# Patient Record
Sex: Female | Born: 1965 | Race: White | Hispanic: No | Smoking: Current some day smoker
Health system: Southern US, Community
[De-identification: ages and names within clinical notes are randomized; demographics above are authoritative.]

## PROBLEM LIST (undated history)

## (undated) DIAGNOSIS — E669 Obesity, unspecified: Secondary | ICD-10-CM

## (undated) DIAGNOSIS — I10 Essential (primary) hypertension: Secondary | ICD-10-CM

## (undated) DIAGNOSIS — IMO0001 Reserved for inherently not codable concepts without codable children: Secondary | ICD-10-CM

## (undated) DIAGNOSIS — N926 Irregular menstruation, unspecified: Principal | ICD-10-CM

## (undated) DIAGNOSIS — H9190 Unspecified hearing loss, unspecified ear: Secondary | ICD-10-CM

## (undated) DIAGNOSIS — E079 Disorder of thyroid, unspecified: Secondary | ICD-10-CM

## (undated) DIAGNOSIS — K589 Irritable bowel syndrome without diarrhea: Secondary | ICD-10-CM

## (undated) DIAGNOSIS — F32A Depression, unspecified: Secondary | ICD-10-CM

## (undated) DIAGNOSIS — Z862 Personal history of diseases of the blood and blood-forming organs and certain disorders involving the immune mechanism: Secondary | ICD-10-CM

## (undated) DIAGNOSIS — F329 Major depressive disorder, single episode, unspecified: Secondary | ICD-10-CM

## (undated) DIAGNOSIS — R51 Headache: Secondary | ICD-10-CM

## (undated) HISTORY — DX: Irritable bowel syndrome, unspecified: K58.9

## (undated) HISTORY — DX: Reserved for inherently not codable concepts without codable children: IMO0001

## (undated) HISTORY — DX: Unspecified hearing loss, unspecified ear: H91.90

## (undated) HISTORY — PX: WISDOM TOOTH EXTRACTION: SHX21

## (undated) HISTORY — DX: Obesity, unspecified: E66.9

## (undated) HISTORY — DX: Irregular menstruation, unspecified: N92.6

## (undated) HISTORY — DX: Headache: R51

## (undated) HISTORY — DX: Essential (primary) hypertension: I10

## (undated) HISTORY — DX: Major depressive disorder, single episode, unspecified: F32.9

## (undated) HISTORY — DX: Depression, unspecified: F32.A

## (undated) HISTORY — DX: Disorder of thyroid, unspecified: E07.9

## (undated) HISTORY — DX: Personal history of diseases of the blood and blood-forming organs and certain disorders involving the immune mechanism: Z86.2

---

## 2001-08-26 ENCOUNTER — Ambulatory Visit (HOSPITAL_COMMUNITY): Admission: RE | Admit: 2001-08-26 | Discharge: 2001-08-26 | Payer: Self-pay | Admitting: Family Medicine

## 2001-08-26 ENCOUNTER — Encounter: Payer: Self-pay | Admitting: Family Medicine

## 2002-01-09 ENCOUNTER — Encounter: Payer: Self-pay | Admitting: Family Medicine

## 2002-01-09 ENCOUNTER — Ambulatory Visit (HOSPITAL_COMMUNITY): Admission: RE | Admit: 2002-01-09 | Discharge: 2002-01-09 | Payer: Self-pay | Admitting: Family Medicine

## 2003-02-27 ENCOUNTER — Ambulatory Visit (HOSPITAL_COMMUNITY): Admission: RE | Admit: 2003-02-27 | Discharge: 2003-02-27 | Payer: Self-pay | Admitting: Family Medicine

## 2003-02-27 ENCOUNTER — Encounter: Payer: Self-pay | Admitting: Family Medicine

## 2003-12-15 ENCOUNTER — Other Ambulatory Visit: Admission: RE | Admit: 2003-12-15 | Discharge: 2003-12-15 | Payer: Self-pay | Admitting: Obstetrics and Gynecology

## 2004-01-15 ENCOUNTER — Emergency Department (HOSPITAL_COMMUNITY): Admission: EM | Admit: 2004-01-15 | Discharge: 2004-01-16 | Payer: Self-pay | Admitting: Emergency Medicine

## 2005-08-15 ENCOUNTER — Emergency Department (HOSPITAL_COMMUNITY): Admission: EM | Admit: 2005-08-15 | Discharge: 2005-08-15 | Payer: Self-pay | Admitting: Emergency Medicine

## 2005-08-15 ENCOUNTER — Inpatient Hospital Stay (HOSPITAL_COMMUNITY): Admission: RE | Admit: 2005-08-15 | Discharge: 2005-08-20 | Payer: Self-pay | Admitting: Psychiatry

## 2005-08-16 ENCOUNTER — Ambulatory Visit: Payer: Self-pay | Admitting: Psychiatry

## 2005-09-10 ENCOUNTER — Ambulatory Visit (HOSPITAL_COMMUNITY): Payer: Self-pay | Admitting: Psychiatry

## 2005-10-08 ENCOUNTER — Ambulatory Visit (HOSPITAL_COMMUNITY): Payer: Self-pay | Admitting: Psychiatry

## 2005-11-19 ENCOUNTER — Ambulatory Visit (HOSPITAL_COMMUNITY): Payer: Self-pay | Admitting: Psychiatry

## 2006-01-21 ENCOUNTER — Ambulatory Visit (HOSPITAL_COMMUNITY): Payer: Self-pay | Admitting: Psychiatry

## 2006-03-25 ENCOUNTER — Ambulatory Visit (HOSPITAL_COMMUNITY): Payer: Self-pay | Admitting: Psychiatry

## 2006-06-26 ENCOUNTER — Ambulatory Visit (HOSPITAL_COMMUNITY): Payer: Self-pay | Admitting: Psychiatry

## 2006-09-16 ENCOUNTER — Ambulatory Visit (HOSPITAL_COMMUNITY): Payer: Self-pay | Admitting: Psychiatry

## 2006-10-16 ENCOUNTER — Ambulatory Visit (HOSPITAL_COMMUNITY): Payer: Self-pay | Admitting: Psychiatry

## 2007-01-17 ENCOUNTER — Ambulatory Visit (HOSPITAL_COMMUNITY): Payer: Self-pay | Admitting: Psychiatry

## 2007-09-15 ENCOUNTER — Ambulatory Visit (HOSPITAL_COMMUNITY): Payer: Self-pay | Admitting: Psychiatry

## 2008-01-01 ENCOUNTER — Ambulatory Visit (HOSPITAL_COMMUNITY): Payer: Self-pay | Admitting: Psychiatry

## 2008-06-24 ENCOUNTER — Ambulatory Visit (HOSPITAL_COMMUNITY): Payer: Self-pay | Admitting: Psychiatry

## 2008-10-07 ENCOUNTER — Ambulatory Visit (HOSPITAL_COMMUNITY): Payer: Self-pay | Admitting: Psychiatry

## 2008-10-18 ENCOUNTER — Emergency Department (HOSPITAL_COMMUNITY): Admission: EM | Admit: 2008-10-18 | Discharge: 2008-10-18 | Payer: Self-pay | Admitting: Emergency Medicine

## 2009-02-01 ENCOUNTER — Ambulatory Visit (HOSPITAL_COMMUNITY): Payer: Self-pay | Admitting: Psychiatry

## 2009-02-24 ENCOUNTER — Other Ambulatory Visit: Admission: RE | Admit: 2009-02-24 | Discharge: 2009-02-24 | Payer: Self-pay | Admitting: Obstetrics and Gynecology

## 2009-05-03 ENCOUNTER — Ambulatory Visit (HOSPITAL_COMMUNITY): Payer: Self-pay | Admitting: Psychiatry

## 2009-08-30 ENCOUNTER — Ambulatory Visit (HOSPITAL_COMMUNITY): Payer: Self-pay | Admitting: Psychiatry

## 2010-01-19 ENCOUNTER — Ambulatory Visit (HOSPITAL_COMMUNITY): Payer: Self-pay | Admitting: Psychiatry

## 2010-03-30 ENCOUNTER — Ambulatory Visit (HOSPITAL_COMMUNITY): Payer: Self-pay | Admitting: Psychiatry

## 2010-06-27 ENCOUNTER — Ambulatory Visit (HOSPITAL_COMMUNITY)
Admission: RE | Admit: 2010-06-27 | Discharge: 2010-06-27 | Payer: Self-pay | Source: Home / Self Care | Attending: Psychiatry | Admitting: Psychiatry

## 2010-09-19 LAB — URINE MICROSCOPIC-ADD ON

## 2010-09-19 LAB — URINE CULTURE: Colony Count: >=100000

## 2010-09-19 LAB — URINALYSIS, ROUTINE W REFLEX MICROSCOPIC
Bilirubin Urine: NEGATIVE
Glucose, UA: NEGATIVE mg/dL
Ketones, ur: 15 mg/dL — AB
Leukocytes, UA: NEGATIVE
Nitrite: NEGATIVE
Protein, ur: >300 mg/dL — AB
Specific Gravity, Urine: >1.03 — ABNORMAL HIGH (ref 1.005–1.030)
Urobilinogen, UA: 0.2 mg/dL (ref 0.0–1.0)
pH: 6 (ref 5.0–8.0)

## 2010-09-26 ENCOUNTER — Encounter (HOSPITAL_COMMUNITY): Payer: Self-pay | Admitting: Psychiatry

## 2010-10-27 NOTE — Discharge Summary (Signed)
NAMEBENAY, POMEROY NO.:  [id number]   MEDICAL RECORD NO.:  [id number]          PATIENT TYPE:  IPS   LOCATION:  0504                          FACILITY:  [facility]   PHYSICIAN:  Jeanice Lim, M.D. DATE OF BIRTH:  1965-12-14   DATE OF ADMISSION:  08/15/2005  DATE OF DISCHARGE:  08/20/2005                                 DISCHARGE SUMMARY   IDENTIFYING DATA:  This is a 45 year old divorced female voluntarily  admitted.  Suicidal ideation with plan to overdose.  History of recent  changes in medication.  History of depression, having thoughts of  overdosing, sleeping too much, gaining weight.  No specific stressors.  Thinks about hurting himself when he is alone.  No clear psychotic symptoms.   PAST PSYCHIATRIC HISTORY:  First admission.  No current outpatient  treatment.   MEDICATIONS:  Had been on Prozac prior to Cymbalta.   ALLERGIES:  CELEXA and WELLBUTRIN caused anxiety and insomnia.   FAMILY HISTORY:  Mother, grandmother, sister and brother all have history of  depression.   MEDICAL PROBLEMS:  None.   MEDICATIONS:  Cymbalta 30 mg daily for one month and Mucinex.   ALLERGIES:  SULFA.   PHYSICAL EXAMINATION:  Physical and neurologic exam essentially within  normal limits.   REVIEW OF SYSTEMS:  Negative.  Somewhat overweight.   MENTAL STATUS EXAM:  Fully alert, cooperative.  Good eye contact.  Speech  clear, normal rate.  Mood depressed.  Affect restricted.  Thought processes  goal directed.  No evidence of psychotic symptoms.  Positive suicidal  thoughts.  Cognitively intact.  Judgment and insight impaired.  Appears  sincere.   ADMISSION DIAGNOSES:  AXIS I:  Major depressive disorder, recurrent, severe.  AXIS II:  Deferred.  AXIS III:  None.  AXIS IV:  Moderate (problems with primary support group, limited support  system, other psychosocial issues).  AXIS V:  35/55-60.   HOSPITAL COURSE:  The patient was admitted and ordered routine p.r.n.  medications and underwent further monitoring.  Cymbalta was discontinued and  patient was monitored and risk/benefit ratio and alternative treatments were  discussed and patient was optimized on medications.  Had been sleeping  excessively and was given a trial on Zoloft and monitored regarding side  effects.  The patient complained of a headache when she requested pain  medications.  She was discharged with follow-up plan in place with no safety  concerns.  Tolerating medication.  Advised on how long it takes for  medications to work, what to do if experiencing side effects.  Given  medication education review at the time of discharge.   DISCHARGE MEDICATIONS:  1.  Provigil 200 mg, 1/2 in the morning, 1/2 at 11 a.m.  2.  Lamictal 25 mg at bedtime for eight days and then 2 at night.  3.  Allegra 180 mg at bedtime.  4.  Ultram 50 mg, 2 every eight hours as needed.   The patient was to stop Zoloft if headache does not improve.  To be  reevaluated by primary care physician, could stop Lamictal and restart  Zoloft  if headache does not clear after stopping Zoloft.   FOLLOW UP:  The patient had a follow-up appointment with [facility] intensive outpatient treatment for close follow-up to get medications  straight as far as side effects, tolerability and to monitor the patient a  little further in an intensive setting.   DISCHARGE DIAGNOSES:  AXIS I:  Major depressive disorder, recurrent, severe.  AXIS II:  Deferred.  AXIS III:  None.  AXIS IV:  Moderate (problems with primary support group, limited support  system, other psychosocial issues).  AXIS V:  GAF on discharge 60.      Jeanice Lim, M.D.  Electronically Signed     JEM/MEDQ  D:  09/09/2005  T:  09/11/2005  Job:  [id number]

## 2010-10-31 ENCOUNTER — Encounter (HOSPITAL_COMMUNITY): Payer: Self-pay | Admitting: Psychiatry

## 2010-11-02 ENCOUNTER — Encounter (INDEPENDENT_AMBULATORY_CARE_PROVIDER_SITE_OTHER): Payer: Self-pay | Admitting: Psychiatry

## 2010-11-02 DIAGNOSIS — F331 Major depressive disorder, recurrent, moderate: Secondary | ICD-10-CM

## 2010-11-21 ENCOUNTER — Encounter (INDEPENDENT_AMBULATORY_CARE_PROVIDER_SITE_OTHER): Payer: Self-pay | Admitting: Psychiatry

## 2010-11-21 DIAGNOSIS — F329 Major depressive disorder, single episode, unspecified: Secondary | ICD-10-CM

## 2010-11-21 DIAGNOSIS — F3289 Other specified depressive episodes: Secondary | ICD-10-CM

## 2011-01-02 ENCOUNTER — Encounter (HOSPITAL_COMMUNITY): Payer: Self-pay | Admitting: Psychiatry

## 2011-03-06 ENCOUNTER — Encounter (HOSPITAL_COMMUNITY): Payer: Self-pay | Admitting: Psychiatry

## 2011-03-15 ENCOUNTER — Encounter (INDEPENDENT_AMBULATORY_CARE_PROVIDER_SITE_OTHER): Payer: Self-pay | Admitting: Psychiatry

## 2011-03-15 DIAGNOSIS — F331 Major depressive disorder, recurrent, moderate: Secondary | ICD-10-CM

## 2011-05-08 ENCOUNTER — Ambulatory Visit (HOSPITAL_COMMUNITY): Payer: Self-pay | Admitting: Psychiatry

## 2011-05-08 ENCOUNTER — Encounter (HOSPITAL_COMMUNITY): Payer: Self-pay | Admitting: Psychiatry

## 2011-05-22 ENCOUNTER — Ambulatory Visit (HOSPITAL_COMMUNITY): Payer: Self-pay | Admitting: Psychiatry

## 2011-05-22 DIAGNOSIS — F32A Depression, unspecified: Secondary | ICD-10-CM

## 2011-05-22 DIAGNOSIS — F3289 Other specified depressive episodes: Secondary | ICD-10-CM

## 2011-05-22 DIAGNOSIS — F329 Major depressive disorder, single episode, unspecified: Secondary | ICD-10-CM

## 2011-05-22 MED ORDER — CITALOPRAM HYDROBROMIDE 40 MG PO TABS: 40.0000 mg | ORAL_TABLET | Freq: Every day | ORAL | Status: DC

## 2011-05-22 NOTE — Progress Notes (Signed)
Joanne Ertl [id number]  Subjective Patient came in for her followup appointment. She's been doing good on her current medication. She is keeping her job at [employer]. Patient reported 2 months ago she was fired as she was taking care of her daughter who has recently delivered a baby girl however after talking to given resources patient was able to get her job back. Patient is very excited and like her current job. Her grandchild is now 39 months old. She denies any crying spells agitation or social isolation. She does not require trazodone. She is sleeping better. We are still waiting for her blood work which she [facility]. She was informed that she will get results in mail.  Mental Status Examination  patient is casually dressed and well groomed. She denies any active or passive suicidal thoughts or homicidal thoughts. She described her mood is good and her affect is bright. Her speech is soft clear and coherent. There are no psychotic symptoms present. She denies any auditory or visual hallucination. Her attention and concentration is good. Her insight judgment and impulse control is okay  Current Medication Current outpatient prescriptions:citalopram (CELEXA) 40 MG tablet, Take 1 tablet (40 mg total) by mouth daily., Disp: 90 tablet, Rfl: 0   Assessment Depressive disorder NOS  Plan I will continue Celexa 40 mg daily. Patient will send has lab results soon. I have explained risks and benefits of medication. I recommended to call if she has worsening of her depression. I will see her in 3 months

## 2011-05-25 ENCOUNTER — Other Ambulatory Visit (HOSPITAL_COMMUNITY): Payer: Self-pay | Admitting: Psychiatry

## 2011-08-21 ENCOUNTER — Ambulatory Visit (INDEPENDENT_AMBULATORY_CARE_PROVIDER_SITE_OTHER): Payer: Self-pay | Admitting: Psychiatry

## 2011-08-21 ENCOUNTER — Encounter (HOSPITAL_COMMUNITY): Payer: Self-pay | Admitting: Psychiatry

## 2011-08-21 DIAGNOSIS — F32A Depression, unspecified: Secondary | ICD-10-CM | POA: Insufficient documentation

## 2011-08-21 DIAGNOSIS — F329 Major depressive disorder, single episode, unspecified: Secondary | ICD-10-CM

## 2011-08-21 DIAGNOSIS — F3289 Other specified depressive episodes: Secondary | ICD-10-CM

## 2011-08-21 MED ORDER — CITALOPRAM HYDROBROMIDE 40 MG PO TABS: 40.0000 mg | ORAL_TABLET | Freq: Every day | ORAL | Status: DC

## 2011-08-21 NOTE — Progress Notes (Signed)
Chief complaint Medication management and followup  History of present illness Patient is 46 year old Caucasian currently unemployed female who came for her followup appointment. Patient was laid off in January from her job and now hoping she can find another job. She is busy with her grandson who is now 41 months old. She enjoyed to spending time with him. She denies any crying spells insomnia or agitation. She is somewhat concerned about her future but overall she is social and motivated to do things. She had applied and waiting for her response for employment. She denies any side effects of Celexa. She reported no side effects of medication. She denies drinking alcohol or using illegal substances. She is currently on unemployment.  Current psychiatric medication Celexa 40 mg daily  Past psychiatric history Patient has history of depression since 2000. She had tried Prozac, Cymbalta, Zoloft and Wellbutrin. She denies any history of suicidal attempt however [facility] due to suicidal thinking to overdose on pills.  Medical history Headache but does not take any prescribed medication  Mental status examination Patient is well groomed well dressed. She is calm cooperative and pleasant. She maintained good eye contact. Her speech is soft clear and coherent. Her thought process is logical linear and goal-directed. She denies any paranoia or delusional thinking. She denies any auditory or visual hallucination. She described her mood as anxious and her affect is mood congruent. She denies any active or passive suicidal thinking and homicidal thinking. She's alert and oriented x3. Her attention and concentration is fair. Her insight judgment and impulse control is okay.  Assessment Depressive disorder NOS Axis II deferred Axis III see medical history Axis IV mild to moderate  Plan I have reviewed her blood work which was done in December. She has a normal  CBC and chemistry. I have discussed for blood results the patient. I will continue Celexa 40 mg daily. At this time patient reported no side effects of medication. I will see her again in 3 months. Time spent 30 minutes

## 2011-11-20 ENCOUNTER — Encounter (HOSPITAL_COMMUNITY): Payer: Self-pay | Admitting: Psychiatry

## 2011-11-20 ENCOUNTER — Ambulatory Visit (INDEPENDENT_AMBULATORY_CARE_PROVIDER_SITE_OTHER): Payer: Self-pay | Admitting: Psychiatry

## 2011-11-20 DIAGNOSIS — F32A Depression, unspecified: Secondary | ICD-10-CM

## 2011-11-20 DIAGNOSIS — F3289 Other specified depressive episodes: Secondary | ICD-10-CM

## 2011-11-20 DIAGNOSIS — F329 Major depressive disorder, single episode, unspecified: Secondary | ICD-10-CM

## 2011-11-20 MED ORDER — CITALOPRAM HYDROBROMIDE 40 MG PO TABS: 40.0000 mg | ORAL_TABLET | Freq: Every day | ORAL | Status: DC

## 2011-11-20 NOTE — Progress Notes (Signed)
Chief complaint Medication management and followup  History of present illness Patient is 46 year old Caucasian currently unemployed female who came for her followup appointment.  Recently patient has been under stress .  Her father diagnosed with lung cancer with metastasis to his brain.  He will start radiation and chemotherapy.  Patient is very close to his father and very upset with this news.  She's afraid about her father health.  Patient is not very close with her mother.  She likes Celexa at present does.  She admitted recently having some crying spells and poor sleep but does not want to try any other medication.  She denies any agitation anger mood swing.  She has no issues with Celexa.  She continues to have difficulty finding job.  She's not taking using any illegal substance.  Current psychiatric medication Celexa 40 mg daily  Past psychiatric history Patient has history of depression since 2000. She had tried Prozac, Cymbalta, Zoloft and Wellbutrin. She denies any history of suicidal attempt however [facility] due to suicidal thinking to overdose on pills.  Medical history Headache but does not take any prescribed medication  Mental status examination Patient is well groomed well dressed. She is anxious but cooperative.  She maintained fair eye contact.  She described her mood is sad and her affect is constricted.  She denies any active or passive suicidal thoughts or homicidal thoughts.  She denies any psychotic symptoms.  Her attention and concentration is fair.  She denies any auditory or visual hallucination.  She's alert and oriented x3.  Her insight judgment and impulse control is okay.  Assessment Depressive disorder NOS Axis II deferred Axis III see medical history Axis IV mild to moderate  Plan I offered counseling however patient refused .  She agreed to give [place] a call if she needed counseling and therapy.  I will continue her  Celexa 40 mg at present does.  She denies any side effects of medication.  I recommend to call us if she is a question or concern about the medication or if she feel worsening of the symptoms.  I will see her again in 3 months.    Portion of this note is generated with voice recognition software and may contain typographical error.

## 2012-02-19 ENCOUNTER — Encounter (HOSPITAL_COMMUNITY): Payer: Self-pay | Admitting: Psychiatry

## 2012-02-19 ENCOUNTER — Ambulatory Visit (INDEPENDENT_AMBULATORY_CARE_PROVIDER_SITE_OTHER): Payer: Self-pay | Admitting: Psychiatry

## 2012-02-19 DIAGNOSIS — F32A Depression, unspecified: Secondary | ICD-10-CM

## 2012-02-19 DIAGNOSIS — F329 Major depressive disorder, single episode, unspecified: Secondary | ICD-10-CM

## 2012-02-19 DIAGNOSIS — F3289 Other specified depressive episodes: Secondary | ICD-10-CM

## 2012-02-19 MED ORDER — CITALOPRAM HYDROBROMIDE 40 MG PO TABS: 40.0000 mg | ORAL_TABLET | Freq: Every day | ORAL | Status: DC

## 2012-02-19 NOTE — Progress Notes (Signed)
Chief complaint Medication management and followup  History of present illness Patient is 46 year old Caucasian female who came for her followup appointment.  Patient recently hired again 3 weeks ago and feeling very happy about her job.  She's been compliant with Celexa and reported no side effects.  She's living with her mother but like to have her own place.  She denies any agitation anger mood swing.  She denies any crying spells.  She is worried about her father who has a lung cancer and recently moved to a nursing home.  She denies any side effects of Celexa.  She's not taking using any illegal substance.  Current psychiatric medication Celexa 40 mg daily  Past psychiatric history Patient has history of depression since 2000. She had tried Prozac, Cymbalta, Zoloft and Wellbutrin. She denies any history of suicidal attempt however [facility] due to suicidal thinking to overdose on pills.  Medical history Headache but does not take any prescribed medication  Social history Patient lives with her mother however she wants to have her own place because she does not get along with her mother.  Her father is living in nursing home.  He suffering from metastasis lung cancer.  Mental status examination Patient is well groomed well dressed. She is anxious but cooperative.  She maintained fair eye contact.  She described her mood is good and her affect is mood appropriate.  She denies any active or passive suicidal thoughts or homicidal thoughts.  She denies any psychotic symptoms.  Her attention and concentration is fair.  She denies any auditory or visual hallucination.  She's alert and oriented x3.  Her insight judgment and impulse control is okay.  Assessment Depressive disorder NOS Axis II deferred Axis III see medical history Axis IV mild to moderate  Plan I will continue Celexa at present does it.  I explained risks and benefits of medication.   We will consider getting blood work on her next visit.  I recommend to call us if she has any question or concern if she feels worsening of the symptom.  I will see her again in 3 months.  Portion of this note is generated with voice recognition software and may contain typographical error.

## 2012-02-26 ENCOUNTER — Emergency Department (HOSPITAL_COMMUNITY)
Admission: EM | Admit: 2012-02-26 | Discharge: 2012-02-26 | Disposition: A | Discharge status: Home / Self Care | Payer: Self-pay | Attending: Emergency Medicine | Admitting: Emergency Medicine

## 2012-02-26 ENCOUNTER — Emergency Department (HOSPITAL_COMMUNITY): Payer: Self-pay

## 2012-02-26 ENCOUNTER — Encounter (HOSPITAL_COMMUNITY): Payer: Self-pay | Admitting: Emergency Medicine

## 2012-02-26 DIAGNOSIS — Z882 Allergy status to sulfonamides status: Secondary | ICD-10-CM | POA: Insufficient documentation

## 2012-02-26 DIAGNOSIS — J189 Pneumonia, unspecified organism: Secondary | ICD-10-CM | POA: Insufficient documentation

## 2012-02-26 DIAGNOSIS — F17200 Nicotine dependence, unspecified, uncomplicated: Secondary | ICD-10-CM | POA: Insufficient documentation

## 2012-02-26 MED ORDER — CIPROFLOXACIN HCL 500 MG PO TABS: 500.0000 mg | ORAL_TABLET | Freq: Two times a day (BID) | ORAL | Status: DC

## 2012-02-26 MED ORDER — IBUPROFEN 800 MG PO TABS
800.0000 mg | ORAL_TABLET | Freq: Once | ORAL | Status: AC
  Administered 2012-02-26: 800 mg via ORAL
  Filled 2012-02-26: qty 1

## 2012-02-26 MED ORDER — CIPROFLOXACIN HCL 250 MG PO TABS
500.0000 mg | ORAL_TABLET | Freq: Once | ORAL | Status: AC
  Administered 2012-02-26: 500 mg via ORAL
  Filled 2012-02-26: qty 2

## 2012-02-26 MED ORDER — ONDANSETRON HCL 4 MG PO TABS
4.0000 mg | ORAL_TABLET | Freq: Once | ORAL | Status: AC
  Administered 2012-02-26: 4 mg via ORAL
  Filled 2012-02-26: qty 1

## 2012-02-26 MED ORDER — PROMETHAZINE-CODEINE 6.25-10 MG/5ML PO SYRP: 5.0000 mL | ORAL_SOLUTION | Freq: Four times a day (QID) | ORAL | Status: DC | PRN

## 2012-02-26 NOTE — ED Notes (Signed)
Pt states woke up with dry cough/congestion this am. C/o body hurting. Denies fever/chills/v/d. Nad. Color wnl. No resp distress

## 2012-03-03 NOTE — ED Provider Notes (Signed)
History     CSN: [id number]  Arrival date & time 02/26/12  1052   First MD Initiated Contact with Patient 02/26/12 1219      Chief Complaint  Patient presents with  . Cough  . Generalized Body Aches    (Consider location/radiation/quality/duration/timing/severity/associated sxs/prior treatment) Patient is a 46 y.o. female presenting with cough. The history is provided by the patient.  Cough This is a new problem. The current episode started 6 to 12 hours ago. The problem occurs hourly. The problem has been gradually worsening. The cough is non-productive. Associated symptoms include rhinorrhea. Pertinent negatives include no chest pain, no chills, no sweats, no shortness of breath and no wheezing. She has tried nothing for the symptoms. She is a smoker. Her past medical history is significant for bronchitis.    Past Medical History  Diagnosis Date  . History of anemia   . Headache   . Hearing impairment     History reviewed. No pertinent past surgical history.  Family History  Problem Relation Age of Onset  . Depression Sister   . Depression Brother     History  Substance Use Topics  . Smoking status: Current Every Day Smoker    Types: Cigarettes  . Smokeless tobacco: Not on file  . Alcohol Use: No    OB History    Grav Para Term Preterm Abortions TAB SAB Ect Mult Living                  Review of Systems  Constitutional: Negative for chills and activity change.       All ROS Neg except as noted in HPI  HENT: Positive for hearing loss and rhinorrhea. Negative for nosebleeds and neck pain.   Eyes: Negative for photophobia and discharge.  Respiratory: Positive for cough. Negative for shortness of breath and wheezing.   Cardiovascular: Negative for chest pain and palpitations.  Gastrointestinal: Negative for abdominal pain and blood in stool.  Genitourinary: Negative for dysuria, frequency and hematuria.  Musculoskeletal: Negative for back pain and arthralgias.   Skin: Negative.   Neurological: Positive for weakness. Negative for dizziness, seizures and speech difficulty.  Psychiatric/Behavioral: Negative for hallucinations and confusion.    Allergies  Sulfa antibiotics  Home Medications   Current Outpatient Rx  Name Route Sig Dispense Refill  . CITALOPRAM HYDROBROMIDE 40 MG PO TABS Oral Take 1 tablet (40 mg total) by mouth daily. 90 tablet 0  . GUAIFENESIN ER 600 MG PO TB12 Oral Take 1,200 mg by mouth 2 (two) times daily.    . ADULT MULTIVITAMIN W/MINERALS CH Oral Take 1 tablet by mouth daily.    Marland Kitchen CIPROFLOXACIN HCL 500 MG PO TABS Oral Take 1 tablet (500 mg total) by mouth every 12 (twelve) hours. 14 tablet 0    Take with food  . PROMETHAZINE-CODEINE 6.25-10 MG/5ML PO SYRP Oral Take 5 mLs by mouth every 6 (six) hours as needed for cough. 150 mL 0    BP 120/75  Pulse 92  Temp 98.7 F (37.1 C)  Resp 17  SpO2 100%  LMP 01/27/2012  Physical Exam  Nursing note and vitals reviewed. Constitutional: She is oriented to person, place, and time. She appears well-developed and well-nourished.  Non-toxic appearance.  HENT:  Head: Normocephalic.  Right Ear: Tympanic membrane and external ear normal.  Left Ear: Tympanic membrane and external ear normal.       Difficulty hearing  Eyes: EOM and lids are normal. Pupils are equal, round, and reactive  to light.  Neck: Normal range of motion. Neck supple. Carotid bruit is not present.  Cardiovascular: Normal rate, regular rhythm, normal heart sounds, intact distal pulses and normal pulses.   Pulmonary/Chest: Breath sounds normal. No respiratory distress.       Course breath sounds with scattered rhonchi. Mild to moderate chest wall soreness.  Abdominal: Soft. Bowel sounds are normal. There is no tenderness. There is no guarding.  Musculoskeletal: Normal range of motion.  Lymphadenopathy:       Head (right side): No submandibular adenopathy present.       Head (left side): No submandibular  adenopathy present.    She has no cervical adenopathy.  Neurological: She is alert and oriented to person, place, and time. She has normal strength. No cranial nerve deficit or sensory deficit.  Skin: Skin is warm and dry.  Psychiatric: She has a normal mood and affect. Her speech is normal.    ED Course  Procedures (including critical care time)  Labs Reviewed - No data to display No results found.   1. Community acquired pneumonia       MDM  I have reviewed nursing notes, vital signs, and all appropriate lab and imaging results for this patient. Chest xray is consistent with RML pneumonia. Rx for cipro and promethazine cough med given to the patient. She will follow up with her primary MD or the [facility] in 4 to 5 days.       Kathie Dike, [place] 03/03/12 2232

## 2012-03-05 NOTE — ED Provider Notes (Signed)
Medical screening examination/treatment/procedure(s) were performed by non-physician practitioner and as supervising physician I was immediately available for consultation/collaboration.   Maveric Debono W. Maurilio Puryear, MD 03/05/12 0039 

## 2012-04-01 ENCOUNTER — Emergency Department (HOSPITAL_COMMUNITY): Payer: BC Managed Care – PPO

## 2012-04-01 ENCOUNTER — Encounter (HOSPITAL_COMMUNITY): Payer: Self-pay

## 2012-04-01 ENCOUNTER — Emergency Department (HOSPITAL_COMMUNITY)
Admission: EM | Admit: 2012-04-01 | Discharge: 2012-04-01 | Disposition: A | Discharge status: Home / Self Care | Payer: BC Managed Care – PPO | Attending: Emergency Medicine | Admitting: Emergency Medicine

## 2012-04-01 DIAGNOSIS — Z8669 Personal history of other diseases of the nervous system and sense organs: Secondary | ICD-10-CM | POA: Insufficient documentation

## 2012-04-01 DIAGNOSIS — IMO0002 Reserved for concepts with insufficient information to code with codable children: Secondary | ICD-10-CM | POA: Insufficient documentation

## 2012-04-01 DIAGNOSIS — M792 Neuralgia and neuritis, unspecified: Secondary | ICD-10-CM

## 2012-04-01 DIAGNOSIS — Z862 Personal history of diseases of the blood and blood-forming organs and certain disorders involving the immune mechanism: Secondary | ICD-10-CM | POA: Insufficient documentation

## 2012-04-01 DIAGNOSIS — F17200 Nicotine dependence, unspecified, uncomplicated: Secondary | ICD-10-CM | POA: Insufficient documentation

## 2012-04-01 MED ORDER — CELECOXIB 100 MG PO CAPS: 100.0000 mg | ORAL_CAPSULE | Freq: Two times a day (BID) | ORAL | Status: DC

## 2012-04-01 NOTE — ED Notes (Signed)
Discharge instructions reviewed with pt, questions answered. Pt verbalized understanding.  

## 2012-04-01 NOTE — ED Notes (Addendum)
Pt c/o numbness to left shoulder, chest, and upper back since Thursday, oct 17th.  Denies any pain or weakness.  C/O frequent headaches.

## 2012-04-01 NOTE — ED Provider Notes (Signed)
History  This chart was scribed for Benny Lennert, MD by Bennett Scrape. This patient was seen in room APA18/APA18 and the patient's care was started at 6:02PM  CSN: [id number]  Arrival date & time 04/01/12  1715   First MD Initiated Contact with Patient 04/01/12 1802      Chief Complaint  Patient presents with  . Numbness     The history is provided by the patient. No language interpreter was used.   Joanne Frazier is a 46 y.o. female who presents to the Emergency Department complaining of 5 days of gradual onset, non-changing, constant left shoulder numbness described as tingling that radiates out into the upper left arm and upper left chest that she noticed while adjusting her bra strap. She denies having any modifying factors. She denies doing any recent heavy lifting or having any recent falls or injuries. She denies having prior episodes of similar symptoms.She denies weakness, CP, neck pain and diaphoresis as associated symptoms. She has a h/o anemia and is a current everyday smoker but denies alcohol use.  Works for [employer] and states that she just returned to work after a lay off.  No PCP  Past Medical History  Diagnosis Date  . History of anemia   . Headache   . Hearing impairment     History reviewed. No pertinent past surgical history.  Family History  Problem Relation Age of Onset  . Depression Sister   . Depression Brother     History  Substance Use Topics  . Smoking status: Current Every Day Smoker    Types: Cigarettes  . Smokeless tobacco: Not on file  . Alcohol Use: No    No OB history provided.  Review of Systems  Constitutional: Negative.  Negative for fatigue.  HENT: Negative for congestion, sinus pressure and ear discharge.   Eyes: Negative.  Negative for discharge.  Respiratory: Negative for cough.   Cardiovascular: Negative for chest pain.  Gastrointestinal: Negative for abdominal pain and diarrhea.  Genitourinary: Negative.   Negative for frequency and hematuria.  Musculoskeletal: Negative for back pain.  Skin: Negative for rash.  Neurological: Positive for numbness. Negative for tremors, seizures, speech difficulty, weakness and headaches.  Hematological: Negative.   Psychiatric/Behavioral: Negative for hallucinations.    Allergies  Sulfa antibiotics  Home Medications   Current Outpatient Rx  Name Route Sig Dispense Refill  . CIPROFLOXACIN HCL 500 MG PO TABS Oral Take 1 tablet (500 mg total) by mouth every 12 (twelve) hours. 14 tablet 0    Take with food  . CITALOPRAM HYDROBROMIDE 40 MG PO TABS Oral Take 1 tablet (40 mg total) by mouth daily. 90 tablet 0  . GUAIFENESIN ER 600 MG PO TB12 Oral Take 1,200 mg by mouth 2 (two) times daily.    . ADULT MULTIVITAMIN W/MINERALS CH Oral Take 1 tablet by mouth daily.    Marland Kitchen PROMETHAZINE-CODEINE 6.25-10 MG/5ML PO SYRP Oral Take 5 mLs by mouth every 6 (six) hours as needed for cough. 150 mL 0    Triage Vitals: BP 139/79  Pulse 87  Temp 98.2 F (36.8 C) (Oral)  Resp 20  Ht 5\' 6"  (1.676 m)  Wt 217 lb (98.431 kg)  BMI 35.02 kg/m2  SpO2 98%  LMP 03/09/2012  Physical Exam  Nursing note and vitals reviewed. Constitutional: She is oriented to person, place, and time. She appears well-developed and well-nourished.  HENT:  Head: Normocephalic and atraumatic.  Eyes: Conjunctivae normal and EOM are normal. No scleral  icterus.  Neck: Normal range of motion. Neck supple. No thyromegaly present.  Cardiovascular: Normal rate and regular rhythm.  Exam reveals no gallop and no friction rub.   No murmur heard. Pulmonary/Chest: Effort normal and breath sounds normal. No stridor. She has no wheezes. She has no rales. She exhibits no tenderness.  Abdominal: She exhibits no distension. There is no tenderness. There is no rebound.  Musculoskeletal: Normal range of motion. She exhibits no edema.  Lymphadenopathy:    She has no cervical adenopathy.  Neurological: She is alert  and oriented to person, place, and time. Coordination normal.       Mild decreased sensation around the left shoulder anteriorly and posteriorly, normal strength in upper extremities    Skin: No rash noted. No erythema.  Psychiatric: She has a normal mood and affect. Her behavior is normal.    ED Course  Procedures (including critical care time)  DIAGNOSTIC STUDIES: Oxygen Saturation is 98% on room air, normal by my interpretation.    COORDINATION OF CARE: 6:10PM-Patient informed of current plan for treatment and evaluation and agrees with plan at this time.   8:10 PM-Discussed discharge plan which includes pain medication and a referral to a neurologist with pt and pt agreed to plan.  Labs Reviewed - No data to display Dg Cervical Spine Complete  04/01/2012  *RADIOLOGY REPORT*  Clinical Data: Left shoulder pain and left upper extremity numbness.  CERVICAL SPINE - COMPLETE 4+ VIEW  Comparison: None.  Findings: AP, lateral, swimmers, obliques and odontoid view of the cervical spine were obtained.  Normal alignment of the cervical spine.  Normal appearance of the prevertebral soft tissues.  Neural foramen are patent. No evidence for acute fracture.  IMPRESSION: Normal cervical spine series.   Original Report Authenticated By: Richarda Overlie, M.D.      No diagnosis found.    MDM        The chart was scribed for me under my direct supervision.  I personally performed the history, physical, and medical decision making and all procedures in the evaluation of this patient.Benny Lennert, MD 04/01/12 2020

## 2012-05-20 ENCOUNTER — Ambulatory Visit (INDEPENDENT_AMBULATORY_CARE_PROVIDER_SITE_OTHER): Payer: BC Managed Care – PPO | Admitting: Psychiatry

## 2012-05-20 ENCOUNTER — Encounter (HOSPITAL_COMMUNITY): Payer: Self-pay | Admitting: Psychiatry

## 2012-05-20 VITALS — BP 140/82 | HR 72 | Ht 66.0 in | Wt 225.4 lb

## 2012-05-20 DIAGNOSIS — F3289 Other specified depressive episodes: Secondary | ICD-10-CM

## 2012-05-20 DIAGNOSIS — F32A Depression, unspecified: Secondary | ICD-10-CM

## 2012-05-20 DIAGNOSIS — F329 Major depressive disorder, single episode, unspecified: Secondary | ICD-10-CM

## 2012-05-20 MED ORDER — CITALOPRAM HYDROBROMIDE 40 MG PO TABS: 40.0000 mg | ORAL_TABLET | Freq: Every day | ORAL | Status: DC

## 2012-05-20 NOTE — Patient Instructions (Addendum)
Could use "Move Free" or "Osteo bi Flex" for arthritic pain.   The important ingredients are Chondrotin Sulfate and Glucosamine.  Tumeric is also helpful for arthritis.   Cut back on sugar and carbohydrates, that means very limited fruits and starchy vegetables and very limited grains, breads  The goal is low GLYCEMIC INDEX.  Cut back on all wheat, rye, or barley  Eat avocados, eggs, lean meat like grass fed beef and chicken  For your brain regular exercise, regular sleep, and  consume good quality, fish oil, 1000 mg twice a day. Staying off all abusable substances including nicotine, caffeine, and refined sugar and avoiding further head injuries are the other important elements in helping you keep your brain working the best it can for you.  Make an appointment for counseling.  It may be very helpful for a better adjustment.

## 2012-05-20 NOTE — Progress Notes (Signed)
Chief complaint Chief Complaint  Patient presents with  . Depression  . Follow-up  . Establish Care  . Medication Refill   Subjective: "I didn't know nothing about Dr Lolly Mustache not being here any more.  I'm doing pretty good otherwise".  History of present illness Patient is 46 year old Caucasian female who came for her followup appointment. Pt reports compliance with psychotropic medication enjoying benefits without any side effects.  She notes no symptoms of depression, but is feeling some sadness and loss with the recent passing of her father.  He died of lung cancer in 26-Mar-2023.  She is now thinking about stopping smoking again.  She had quit for 10 years before.  She got started back when she lost her job.  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   Current psychiatric medication Celexa 40 mg daily  Past psychiatric history Patient has history of depression since 2000. She had tried Prozac, Cymbalta, Zoloft and Wellbutrin. She denies any history of suicidal attempt however [facility] due to suicidal thinking to overdose on pills.  Medical history Headache but does not take any prescribed medication  Social history Patient lives with her mother however she wants to have her own place because she does not get along with her mother.  Her father is living in nursing home.  He suffering from metastasis lung cancer.  Mental status examination Patient is well groomed well dressed. She is anxious but cooperative.  She maintained fair eye contact.  She described her mood is good and her affect is mood appropriate.  She denies any active or passive suicidal thoughts or homicidal thoughts.  She denies any psychotic symptoms.  Her attention and concentration is fair.  She denies any auditory or visual hallucination.  She's alert and oriented x3.  Her insight judgment and impulse control is okay.  Assessment Depressive disorder NOS Axis II  deferred Axis III see medical history Axis IV mild to moderate  Plan I took her vitals.  I reviewed CC, tobacco/med/surg Hx, meds effects/ side effects, problem list, therapies and responses as well as current situation/symptoms discussed options. See orders and pt instructions for more details.

## 2012-08-06 ENCOUNTER — Other Ambulatory Visit (HOSPITAL_COMMUNITY)
Admission: RE | Admit: 2012-08-06 | Discharge: 2012-08-06 | Disposition: A | Discharge status: Home / Self Care | Payer: BC Managed Care – PPO | Source: Ambulatory Visit | Attending: Obstetrics and Gynecology | Admitting: Obstetrics and Gynecology

## 2012-08-06 ENCOUNTER — Other Ambulatory Visit: Payer: Self-pay | Admitting: Adult Health

## 2012-08-06 ENCOUNTER — Other Ambulatory Visit (HOSPITAL_COMMUNITY): Payer: Self-pay | Admitting: Adult Health

## 2012-08-06 DIAGNOSIS — Z1151 Encounter for screening for human papillomavirus (HPV): Secondary | ICD-10-CM | POA: Insufficient documentation

## 2012-08-06 DIAGNOSIS — Z139 Encounter for screening, unspecified: Secondary | ICD-10-CM

## 2012-08-06 DIAGNOSIS — Z01419 Encounter for gynecological examination (general) (routine) without abnormal findings: Secondary | ICD-10-CM | POA: Insufficient documentation

## 2012-08-11 ENCOUNTER — Ambulatory Visit (HOSPITAL_COMMUNITY)
Admission: RE | Admit: 2012-08-11 | Discharge: 2012-08-11 | Disposition: A | Discharge status: Home / Self Care | Payer: BC Managed Care – PPO | Source: Ambulatory Visit | Attending: Adult Health | Admitting: Adult Health

## 2012-08-11 DIAGNOSIS — Z139 Encounter for screening, unspecified: Secondary | ICD-10-CM

## 2012-08-11 DIAGNOSIS — Z1231 Encounter for screening mammogram for malignant neoplasm of breast: Secondary | ICD-10-CM | POA: Insufficient documentation

## 2012-08-18 ENCOUNTER — Ambulatory Visit (INDEPENDENT_AMBULATORY_CARE_PROVIDER_SITE_OTHER): Payer: BC Managed Care – PPO | Admitting: Psychiatry

## 2012-08-18 ENCOUNTER — Encounter (HOSPITAL_COMMUNITY): Payer: Self-pay | Admitting: Psychiatry

## 2012-08-18 VITALS — Wt 228.4 lb

## 2012-08-18 DIAGNOSIS — F3289 Other specified depressive episodes: Secondary | ICD-10-CM

## 2012-08-18 DIAGNOSIS — R6882 Decreased libido: Secondary | ICD-10-CM

## 2012-08-18 DIAGNOSIS — F329 Major depressive disorder, single episode, unspecified: Secondary | ICD-10-CM

## 2012-08-18 DIAGNOSIS — F32A Depression, unspecified: Secondary | ICD-10-CM

## 2012-08-18 MED ORDER — CITALOPRAM HYDROBROMIDE 20 MG PO TABS: 30.0000 mg | ORAL_TABLET | Freq: Every day | ORAL | Status: DC

## 2012-08-18 MED ORDER — CYPROHEPTADINE HCL 4 MG PO TABS: 4.0000 mg | ORAL_TABLET | ORAL | Status: DC | PRN

## 2012-08-18 NOTE — Progress Notes (Signed)
[facility] [id number] Progress Note Joanne Frazier MRN: [id number] DOB: 12-26-1965 Age: 47 y.o.  Date: 08/18/2012 Start Time: 3:15 PM End Time: 3:30 PM  Chief Complaint: Chief Complaint  Patient presents with  . Depression  . Follow-up  . Medication Refill    Subjective: "I'm doing pretty well and I think I would like to try working my way down on the dose of Celexa.". Depression 0/10 and Anxiety 0/10, where 1 is the best and 10 is the worst.  Pain is 0/10.  History of present illness Patient is 47 year old Caucasian female who came for her followup appointment. Pt reports that she is compliant with the psychotropic medications with good benefit and some side effects.  She has noted decreased libido on the 40 mg where the libido was more present on 30 mg dose.  Discussed her trying Periactin for that.  She will also try a lower dose of Celexa too. She has been on this for well over 10 years.  This could be a reasonable time to try off the Celexa.  Will go slow as she notes some uncomfortable changes when she runs out of the medicine.   Current psychiatric medication Celexa 40 mg daily  Past psychiatric history Patient has history of depression since 2000. She had tried Prozac, Cymbalta, Zoloft and Wellbutrin. She denies any history of suicidal attempt however she was [facility] due to suicidal thinking to overdose on pills.  Medical history Headache but does not take any prescribed medication  Social history Patient lives with her mother however she wants to have her own place because she does not get along with her mother.  Her father is living in nursing home.  He suffering from metastasis lung cancer.  Family History family history includes ADD / ADHD in her daughter and son; Alcohol abuse in her paternal uncle; Depression in her brothers, maternal grandmother, and sister; and Seizures in her father.  There is no history of Drug abuse,  and Dementia, and Bipolar disorder, and Anxiety disorder, and OCD, and Paranoid behavior, and Schizophrenia, and Sexual abuse, and Physical abuse, .  Mental status examination Patient is well groomed well dressed. She is anxious but cooperative.  She maintained fair eye contact.  She described her mood is good and her affect is mood appropriate.  She denies any active or passive suicidal thoughts or homicidal thoughts.  She denies any psychotic symptoms.  Her attention and concentration is fair.  She denies any auditory or visual hallucination.  She's alert and oriented x3.  Her insight judgment and impulse control is okay.  Lab Results: No results found for this or any previous visit (from the past 8736 hour(s)). Dr Mohammed Kindle has drawn labs recently and her cholesterol was of concern.   Assessment Depressive disorder NOS Axis II deferred Axis III see medical history Axis IV mild to moderate  Plan/Discussion: I took her vitals.  I reviewed CC, tobacco/med/surg Hx, meds effects/ side effects, problem list, therapies and responses as well as current situation/symptoms discussed options. Try lower dose of Celexa and try Peroactin for the decreased llibido.  See orders and pt instructions for more details.  Medical Decision Making Problem Points:  Established problem, stable/improving (1), New problem, with no additional work-up planned (3), Review of last therapy session (1) and Review of psycho-social stressors (1) Data Points:  Review or order clinical lab tests (1) Review of medication regiment & side effects (2) Review of new medications  or change in dosage (2)  I certify that outpatient services furnished can reasonably be expected to improve the patient's condition.   Orson Aloe, MD, [facility]

## 2012-08-18 NOTE — Patient Instructions (Addendum)
Black Cohash may be helpful for menopause power surges.  Periactin may be helpful for the decreased libido.  Try using 1.5 of the 20 mg tabs, could try 1.75 tabs if the funny feeling comes back.  Call if problems or concerns.

## 2012-11-18 ENCOUNTER — Encounter (HOSPITAL_COMMUNITY): Payer: Self-pay | Admitting: Psychiatry

## 2012-11-18 ENCOUNTER — Ambulatory Visit (INDEPENDENT_AMBULATORY_CARE_PROVIDER_SITE_OTHER): Payer: BC Managed Care – PPO | Admitting: Psychiatry

## 2012-11-18 VITALS — BP 133/105 | HR 98 | Ht 65.0 in | Wt 229.4 lb

## 2012-11-18 DIAGNOSIS — F3289 Other specified depressive episodes: Secondary | ICD-10-CM

## 2012-11-18 DIAGNOSIS — R6882 Decreased libido: Secondary | ICD-10-CM

## 2012-11-18 DIAGNOSIS — F32A Depression, unspecified: Secondary | ICD-10-CM

## 2012-11-18 DIAGNOSIS — F329 Major depressive disorder, single episode, unspecified: Secondary | ICD-10-CM

## 2012-11-18 MED ORDER — CITALOPRAM HYDROBROMIDE 20 MG PO TABS: 20.0000 mg | ORAL_TABLET | Freq: Every day | ORAL | Status: DC

## 2012-11-18 NOTE — Progress Notes (Signed)
[facility] [id number] Progress Note Joanne Frazier MRN: [id number] DOB: [birth year] Age: 47 y.o.  Date: 11/18/2012 Start Time: 3:50  PM End Time: 4:15 PM  Chief Complaint: Chief Complaint  Patient presents with  . Depression  . Follow-up  . Medication Refill    Subjective: "I'm feeling real rough today.  I possibly have a stomach virus and I have been having a lot of headaches lately.  I am between doctors because my insurance changed and the new doctors won't see me until they get the records form the first doctor". Depression 0/10 and Anxiety 0/10, where 1 is the best and 10 is the worst.  Pain is 5/10 with her back today.  Stomach upset is 4/10 today.  History of present illness Patient is 47 year old Caucasian female who came for her followup appointment. Pt reports that she is compliant with the psychotropic medications with good benefit and no noticeable side effects.  She noted decreased libido on the 40 mg where the libido.  She hasn't even tried the Periactin for that, because she has no partner in her life now.  Current psychiatric medication Celexa 20 mg daily  Past psychiatric history Patient has history of depression since 2000. She had tried Prozac, Cymbalta, Zoloft and Wellbutrin. She denies any history of suicidal attempt however she [facility] due to suicidal thinking to overdose on pills.  Allergies: Allergies  Allergen Reactions  . Sulfa Antibiotics Other (See Comments)    Unknown.    Medical History: Past Medical History  Diagnosis Date  . History of anemia   . Headache(784.0)   . Hearing impairment   . Depression   Headache but does not take any prescribed medication  Surgical History: History reviewed. No pertinent past surgical history. Family History: family history includes ADD / ADHD in her daughter and son; Alcohol abuse in her paternal uncle; Depression in her brothers, maternal grandmother, and  sister; and Seizures in her father.  There is no history of Drug abuse, and Dementia, and Bipolar disorder, and Anxiety disorder, and OCD, and Paranoid behavior, and Schizophrenia, and Sexual abuse, and Physical abuse, . Reviewed and nothing has changed today.  Social history Patient lives with her mother however she wants to have her own place because she does not get along with her mother.  Her father is living in nursing home.  He suffering from metastasis lung cancer.  Mental status examination Patient is well groomed well dressed. She is anxious but cooperative.  She maintained fair eye contact.  She described her mood is good and her affect is mood appropriate.  She denies any active or passive suicidal thoughts or homicidal thoughts.  She denies any psychotic symptoms.  Her attention and concentration is fair.  She denies any auditory or visual hallucination.  She's alert and oriented x3.  Her insight judgment and impulse control is okay.  Lab Results: No results found for this or any previous visit (from the past 8736 hour(s)). Dr Mohammed Kindle will no longer be her PCP.  Assessment Depressive disorder NOS Axis II deferred Axis III see medical history Axis IV mild to moderate  Plan/Discussion: I took her vitals.  I reviewed CC, tobacco/med/surg Hx, meds effects/ side effects, problem list, therapies and responses as well as current situation/symptoms discussed options. Continue lower dose of Celexa and try Peroactin for the decreased llibido.  See orders and pt instructions for more details.  Medical Decision Making Problem Points:  Established  problem, stable/improving (1), New problem, with no additional work-up planned (3), Review of last therapy session (1) and Review of psycho-social stressors (1) Data Points:  Review or order clinical lab tests (1) Review of medication regiment & side effects (2)  I certify that outpatient services furnished can reasonably be expected to  improve the patient's condition.   Orson Aloe, MD, [facility]

## 2012-11-18 NOTE — Patient Instructions (Signed)
Get BP checked out it may explain the headaches.  Set a timer for 8 or a certain number minutes and walk for that amount of time in the house or in the yard.  Mark the number of minutes on a calendar for that day.  Do that every day this week.  Then next week increase the time by 1 minutes and then mark the calendar with the number of minutes for that day.  Each week increase your exercise by one minute.  Keep a record of this so you can see the progress you are making.  Do this every day, just like eating and sleeping.  It is good for pain control, depression, and for your soul/spirit.  Bring the record in for your next visit so we can talk about your effort and how you feel with the new exercise program going and working for you.  Relaxation is the ultimate solution for you.  You can seek it through tub baths, bubble baths, essential oils or incense, walking or chatting with friends, listening to soft music, watching a candle burn and just letting all thoughts go and appreciating the true essence of the Creator.  Pets or animals may be very helpful.  You might spend some time with them and then go do more directed meditation.  "I am Wishes Fulfilled Meditation" by Marylene Buerger and Lyndal Pulley may be helpful MUSIC for getting to sleep or for meditating You can order it from on line.  You might find the Chill channel on Pandora and explore the artists that you like better.   Call if problems or concerns.

## 2012-12-10 IMAGING — CR DG CERVICAL SPINE COMPLETE 4+V
7 series · 7 of 7 positions shown · non-contrast
Comparison: None.

CLINICAL DATA: Left shoulder pain and left upper extremity
numbness.

CERVICAL SPINE - COMPLETE 4+ VIEW

[view not recorded (1 of 7)]
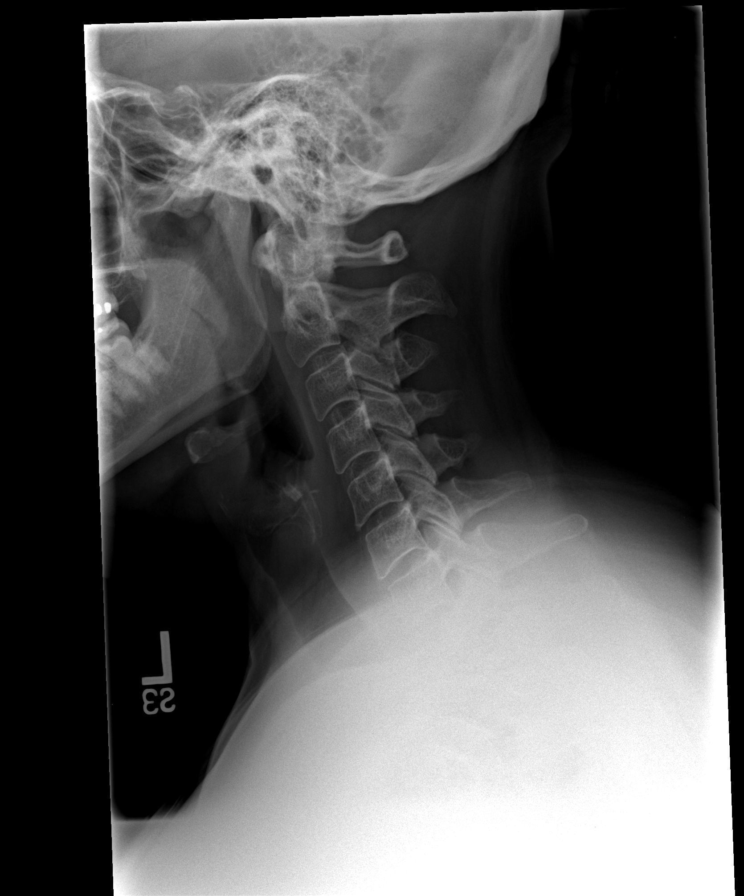

[view not recorded (2 of 7)]
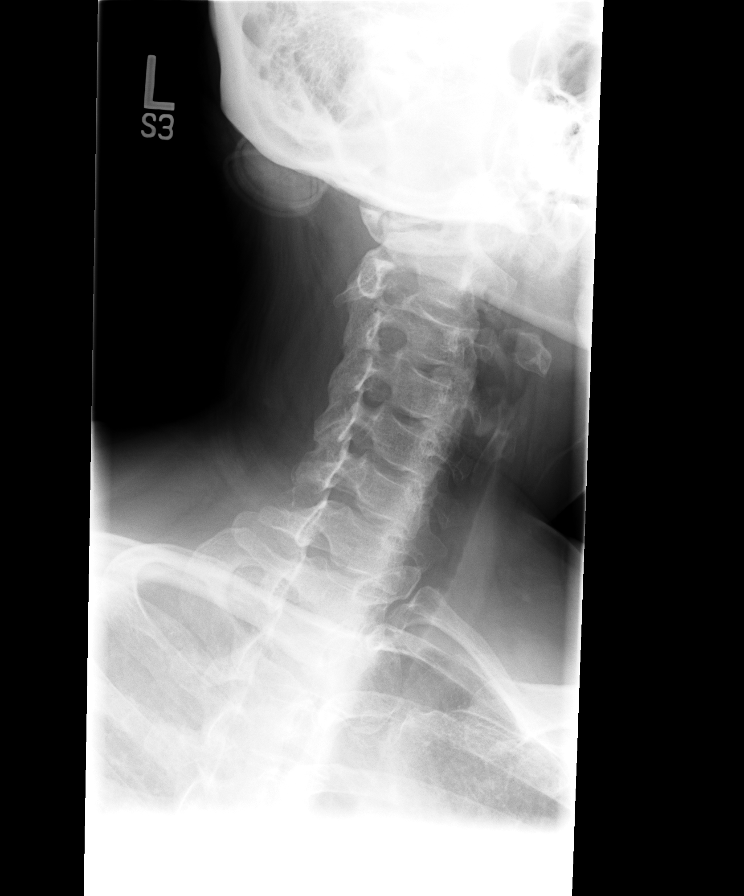

[view not recorded (3 of 7)]
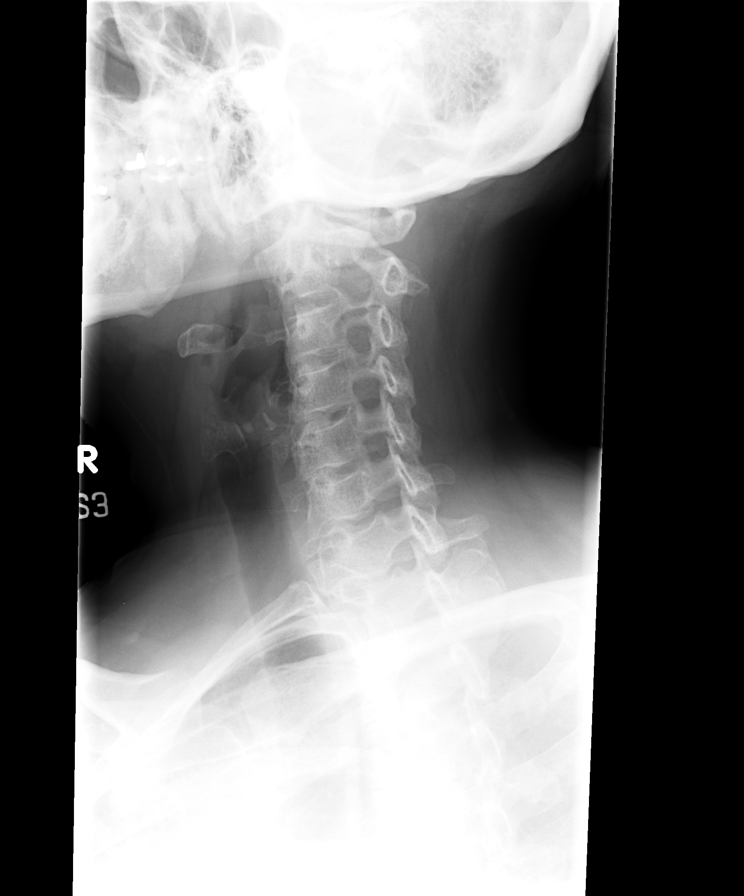

[view not recorded (4 of 7)]
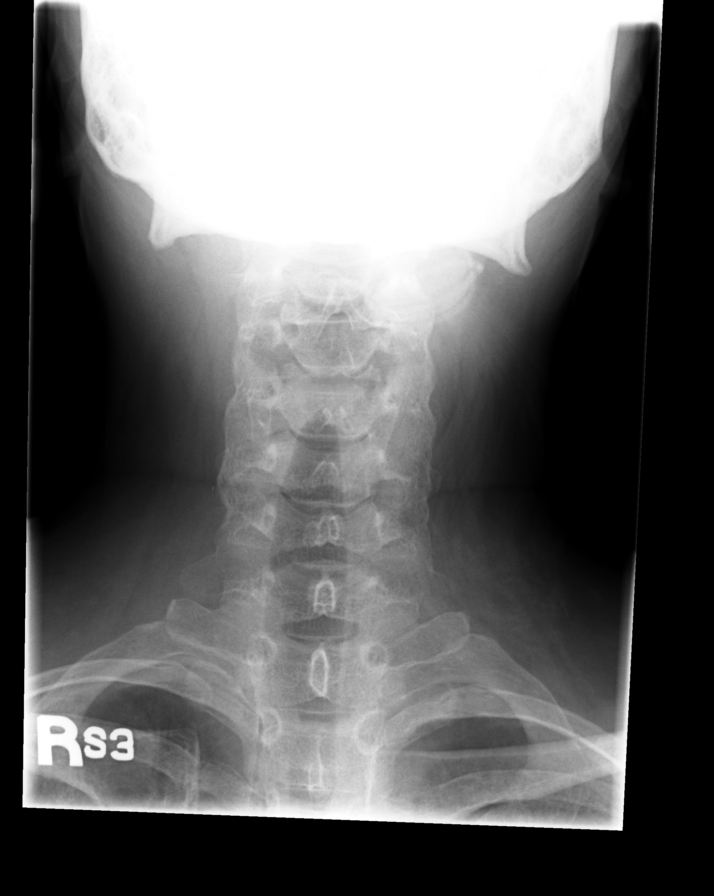

[view not recorded (5 of 7)]
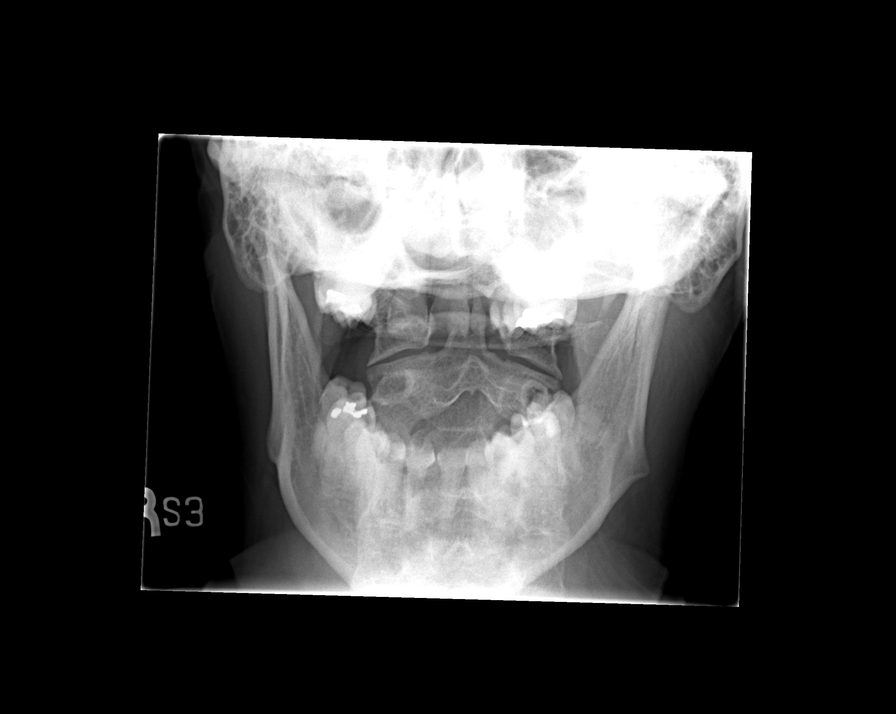

[view not recorded (6 of 7)]
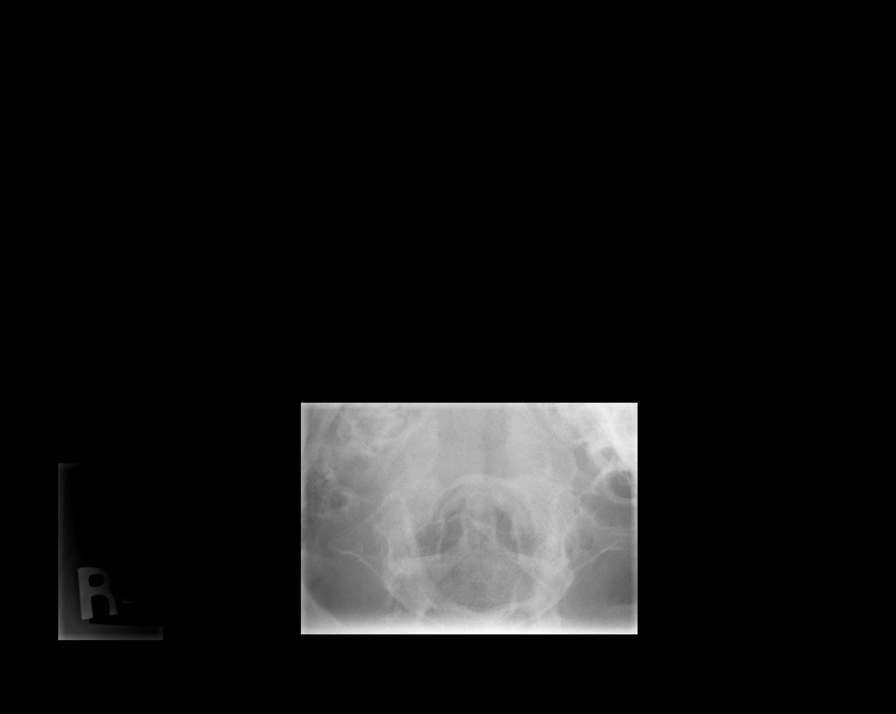

[view not recorded (7 of 7)]
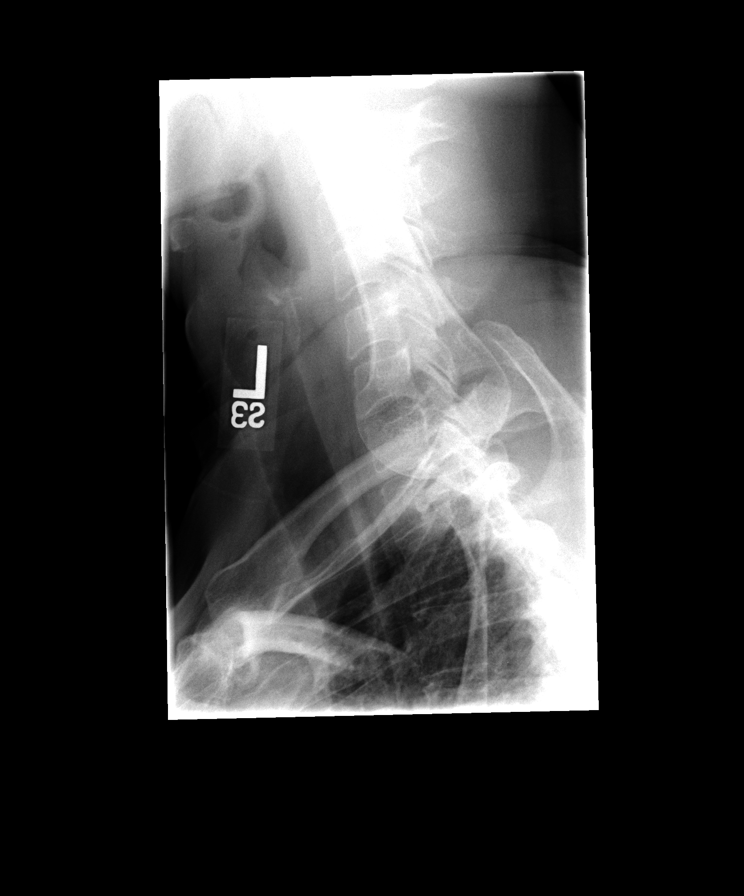

[7 of 7 positions shown; findings below may reference images not displayed]

FINDINGS: AP, lateral, swimmers, obliques and odontoid view of the
cervical spine were obtained.  Normal alignment of the cervical
spine.  Normal appearance of the prevertebral soft tissues.  Neural
foramen are patent. No evidence for acute fracture.
IMPRESSION: Normal cervical spine series.

## 2013-02-18 ENCOUNTER — Ambulatory Visit (HOSPITAL_COMMUNITY): Payer: Self-pay | Admitting: Psychiatry

## 2013-02-19 ENCOUNTER — Encounter (HOSPITAL_COMMUNITY): Payer: Self-pay | Admitting: Psychiatry

## 2013-04-27 ENCOUNTER — Encounter (HOSPITAL_COMMUNITY): Payer: Self-pay | Admitting: Psychiatry

## 2013-04-27 ENCOUNTER — Ambulatory Visit (INDEPENDENT_AMBULATORY_CARE_PROVIDER_SITE_OTHER): Payer: BC Managed Care – PPO | Admitting: Psychiatry

## 2013-04-27 VITALS — BP 140/90 | Ht 65.0 in | Wt 231.0 lb

## 2013-04-27 DIAGNOSIS — F329 Major depressive disorder, single episode, unspecified: Secondary | ICD-10-CM

## 2013-04-27 DIAGNOSIS — F32A Depression, unspecified: Secondary | ICD-10-CM

## 2013-04-27 DIAGNOSIS — F3289 Other specified depressive episodes: Secondary | ICD-10-CM

## 2013-04-27 MED ORDER — CITALOPRAM HYDROBROMIDE 10 MG PO TABS: 10.0000 mg | ORAL_TABLET | Freq: Every day | ORAL | Status: DC

## 2013-04-27 NOTE — Progress Notes (Signed)
Patient ID: Joanne Frazier, female   DOB: Jul 13, 1965, 47 y.o.   MRN: [id number] [facility] [id number] Progress Note Joanne Frazier MRN: [id number] DOB: [birth year] Age: 47 y.o.  Date: 04/27/2013 Start Time: 3:50  PM End Time: 4:15 PM  Chief Complaint: Chief Complaint  Patient presents with  . Anxiety  . Depression  . Establish Care    Subjective: I'm doing well."  This patient is a 47 year old divorced white female who lives with her 19 year old son in [place]. 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  The patient states she began dealing with depression and anxiety approximately 12 years ago. She's not entirely sure why. She has been hospitalized one time. She was on Celexa 40 mg per day and has weaned herself all the way down to 10 mg per day. She's doing well at this dosage. She's been moved to a new department at work and she really enjoys it. She was recently diagnosed with hypothyroidism and her thyroid tests have stabilized on Synthroid. She still feels tired a lot. She suffers from irritable bowel syndrome and has been flaring up lately after she had taken antibiotic for bronchitis. In general her mood is stable and she sleeps well at night.  .  Current psychiatric medication Celexa 10 mg daily  Past psychiatric history Patient has history of depression since 2000. She had tried Prozac, Cymbalta, Zoloft and Wellbutrin. She denies any history of suicidal attempt however [facility] due to suicidal thinking to overdose on pills.  Allergies: Allergies  Allergen Reactions  . Sulfa Antibiotics Other (See Comments)    Unknown.    Medical History: Past Medical History  Diagnosis Date  . History of anemia   . Headache(784.0)   . Hearing impairment   . Depression   . Thyroid disease   . Hypertension   . Irritable bowel syndrome   Headache but does not take any prescribed medication  Surgical  History: No past surgical history on file. Family History: family history includes ADD / ADHD in her daughter and son; Alcohol abuse in her paternal uncle; Depression in her brother, brother, maternal grandmother, and sister; Seizures in her father. There is no history of Drug abuse, Dementia, Bipolar disorder, Anxiety disorder, OCD, Paranoid behavior, Schizophrenia, Sexual abuse, or Physical abuse. Reviewed and nothing has changed today.  Social history .  Her father is living in nursing home.  He suffering from metastasis lung cancer.  Mental status examination Patient is well groomed well dressed. She is anxious but cooperative.  She maintained fair eye contact.  She described her mood is good and her affect is mood appropriate.  She denies any active or passive suicidal thoughts or homicidal thoughts.  She denies any psychotic symptoms.  Her attention and concentration is fair.  She denies any auditory or visual hallucination.  She's alert and oriented x3.  Her insight judgment and impulse control is okay.  Lab Results: No results found for this or any previous visit (from the past 8736 hour(s)). Dr Mohammed Kindle will no longer be her PCP.  Assessment Depressive disorder NOS Axis II deferred Axis III see medical history Axis IV mild to moderate  Plan/Discussion: I took her vitals.  I reviewed CC, tobacco/med/surg Hx, meds effects/ side effects, problem list, therapies and responses as well as current situation/symptoms discussed options. Continue lower dose of Celexa . She will return in 3 months  See orders and pt instructions for  more details.  Medical Decision Making Problem Points:  Established problem, stable/improving (1), New problem, with no additional work-up planned (3), Review of last therapy session (1) and Review of psycho-social stressors (1) Data Points:  Review or order clinical lab tests (1) Review of medication regiment & side effects (2)  I certify that outpatient  services furnished can reasonably be expected to improve the patient's condition.   Diannia Ruder, MD

## 2013-06-01 ENCOUNTER — Telehealth (HOSPITAL_COMMUNITY): Payer: Self-pay

## 2013-06-01 ENCOUNTER — Other Ambulatory Visit (HOSPITAL_COMMUNITY): Payer: Self-pay | Admitting: Psychiatry

## 2013-06-01 MED ORDER — CITALOPRAM HYDROBROMIDE 20 MG PO TABS: 20.0000 mg | ORAL_TABLET | Freq: Every day | ORAL | Status: DC

## 2013-06-01 NOTE — Telephone Encounter (Signed)
done

## 2013-07-16 ENCOUNTER — Telehealth: Payer: Self-pay | Admitting: Gastroenterology

## 2013-07-16 ENCOUNTER — Ambulatory Visit: Payer: Self-pay | Admitting: Gastroenterology

## 2013-07-16 ENCOUNTER — Encounter: Payer: Self-pay | Admitting: Gastroenterology

## 2013-07-16 NOTE — Telephone Encounter (Signed)
Noted. Please send letter

## 2013-07-16 NOTE — Telephone Encounter (Signed)
Pt was a no show

## 2013-07-16 NOTE — Telephone Encounter (Signed)
Mailed letter and PCP is aware °

## 2013-07-20 ENCOUNTER — Other Ambulatory Visit (HOSPITAL_COMMUNITY): Payer: Self-pay | Admitting: Psychiatry

## 2013-07-20 MED ORDER — CITALOPRAM HYDROBROMIDE 20 MG PO TABS: 20.0000 mg | ORAL_TABLET | Freq: Every day | ORAL | Status: DC

## 2013-07-30 ENCOUNTER — Ambulatory Visit (HOSPITAL_COMMUNITY): Payer: Self-pay | Admitting: Psychiatry

## 2013-08-03 ENCOUNTER — Telehealth: Payer: Self-pay | Admitting: Adult Health

## 2013-08-03 NOTE — Telephone Encounter (Signed)
Pt had unprotected sex and is going to get Plan B had questions, to go to drug store and get pill.

## 2013-08-19 ENCOUNTER — Encounter (INDEPENDENT_AMBULATORY_CARE_PROVIDER_SITE_OTHER): Payer: Self-pay

## 2013-08-19 ENCOUNTER — Ambulatory Visit (INDEPENDENT_AMBULATORY_CARE_PROVIDER_SITE_OTHER): Payer: BC Managed Care – PPO | Admitting: Adult Health

## 2013-08-19 ENCOUNTER — Encounter: Payer: Self-pay | Admitting: Adult Health

## 2013-08-19 ENCOUNTER — Other Ambulatory Visit (INDEPENDENT_AMBULATORY_CARE_PROVIDER_SITE_OTHER): Payer: BC Managed Care – PPO | Admitting: Adult Health

## 2013-08-19 VITALS — BP 122/90 | HR 76 | Ht 66.0 in | Wt 231.0 lb

## 2013-08-19 DIAGNOSIS — N926 Irregular menstruation, unspecified: Secondary | ICD-10-CM

## 2013-08-19 DIAGNOSIS — Z1212 Encounter for screening for malignant neoplasm of rectum: Secondary | ICD-10-CM

## 2013-08-19 DIAGNOSIS — Z01419 Encounter for gynecological examination (general) (routine) without abnormal findings: Secondary | ICD-10-CM

## 2013-08-19 HISTORY — DX: Irregular menstruation, unspecified: N92.6

## 2013-08-19 LAB — HEMOCCULT GUIAC POC 1CARD (OFFICE): Fecal Occult Blood, POC: NEGATIVE

## 2013-08-19 NOTE — Progress Notes (Signed)
Patient ID: Joanne Frazier, female   DOB: 12/15/1965, 48 y.o.   MRN: [id number] History of Present Illness: Joanne Frazier is a 48 year old white female, in complaining of having missed a period and has breast tenderness, and needs gyn exam.She had a normal pap with negative HPV 08/06/12.She was seen at PCP today.She is worried about pregnancy but had negative UPT at PCP.Has had some hot flashes in past but has been having periods.Pt is HOH.   Current Medications, Allergies, Past Medical History, Past Surgical History, Family History and Social History were reviewed in [facility] electronic medical record.     Review of Systems: Patient denies any headaches, blurred vision, shortness of breath, chest pain, abdominal pain, problems with bowel movements, urination, or intercourse. No joint pain or mood swings.    Physical Exam:BP 122/90  Pulse 76  Ht 5\' 6"  (1.676 m)  Wt 231 lb (104.781 kg)  BMI 37.30 kg/m2  LMP 06/26/2013 General:  Well developed, well nourished, no acute distress Skin:  Warm and dry Neck:  Midline trachea, normal thyroid Lungs; Clear to auscultation bilaterally Breast:  No dominant palpable mass, retraction, or nipple discharge Cardiovascular: Regular rate and rhythm Abdomen:  Soft, non tender, no hepatosplenomegaly Pelvic:  External genitalia is normal in appearance.  The vagina is normal in appearance.  The cervix is bulbous.  Uterus is felt to be normal size, shape, and contour.  No  adnexal masses or tenderness noted. Rectal: Good sphincter tone, no polyps, or hemorrhoids felt.  Hemoccult negative. Extremities:  No swelling or varicosities noted Psych:  No mood changes, alert and cooperative, seems happy   Impression: Missed period Yearly gyn exam no pap    Plan: Check CBC with diff, CMP, TSH and lipids and QHCG Physical in 1 year Mammogram yearly  Colonoscopy at 3150  Will talk when labs back Review handout on peri menopause

## 2013-08-19 NOTE — Patient Instructions (Signed)
Perimenopause Perimenopause is the time when your body begins to move into the menopause (no menstrual period for 12 straight months). It is a natural process. Perimenopause can begin 2 8 years before the menopause and usually lasts for 1 year after the menopause. During this time, your ovaries may or may not produce an egg. The ovaries vary in their production of estrogen and progesterone hormones each month. This can cause irregular menstrual periods, difficulty getting pregnant, vaginal bleeding between periods, and uncomfortable symptoms. CAUSES  Irregular production of the ovarian hormones, estrogen and progesterone, and not ovulating every month.  Other causes include:  Tumor of the pituitary gland in the brain.  Medical disease that affects the ovaries.  Radiation treatment.  Chemotherapy.  Unknown causes.  Heavy smoking and excessive alcohol intake can bring on perimenopause sooner. SIGNS AND SYMPTOMS   Hot flashes.  Night sweats.  Irregular menstrual periods.  Decreased sex drive.  Vaginal dryness.  Headaches.  Mood swings.  Depression.  Memory problems.  Irritability.  Tiredness.  Weight gain.  Trouble getting pregnant.  The beginning of losing bone cells (osteoporosis).  The beginning of hardening of the arteries (atherosclerosis). DIAGNOSIS  Your health care provider will make a diagnosis by analyzing your age, menstrual history, and symptoms. He or she will do a physical exam and note any changes in your body, especially your female organs. Female hormone tests may or may not be helpful depending on the amount of female hormones you produce and when you produce them. However, other hormone tests may be helpful to rule out other problems. TREATMENT  In some cases, no treatment is needed. The decision on whether treatment is necessary during the perimenopause should be made by you and your health care provider based on how the symptoms are affecting you  and your lifestyle. Various treatments are available, such as:  Treating individual symptoms with a specific medicine for that symptom.  Herbal medicines that can help specific symptoms.  Counseling.  Group therapy. HOME CARE INSTRUCTIONS   Keep track of your menstrual periods (when they occur, how heavy they are, how long between periods, and how long they last) as well as your symptoms and when they started.  Only take over-the-counter or prescription medicines as directed by your health care provider.  Sleep and rest.  Exercise.  Eat a diet that contains calcium (good for your bones) and soy (acts like the estrogen hormone).  Do not smoke.  Avoid alcoholic beverages.  Take vitamin supplements as recommended by your health care provider. Taking vitamin E may help in certain cases.  Take calcium and vitamin D supplements to help prevent bone loss.  Group therapy is sometimes helpful.  Acupuncture may help in some cases. SEEK MEDICAL CARE IF:   You have questions about any symptoms you are having.  You need a referral to a specialist (gynecologist, psychiatrist, or psychologist). SEEK IMMEDIATE MEDICAL CARE IF:   You have vaginal bleeding.  Your period lasts longer than 8 days.  Your periods are recurring sooner than 21 days.  You have bleeding after intercourse.  You have severe depression.  You have pain when you urinate.  You have severe headaches.  You have vision problems. Document Released: 07/05/2004 Document Revised: 03/18/2013 Document Reviewed: 12/25/2012 [facility] Patient Information 2014 [employer], [place]. Mammogram yearly Physical in 1 year Colonoscopy at 5850 Will talk in am

## 2013-08-20 LAB — CBC WITH DIFFERENTIAL/PLATELET
BASOS PCT: 0 % (ref 0–1)
Basophils Absolute: 0 10*3/uL (ref 0.0–0.1)
EOS ABS: 0.1 10*3/uL (ref 0.0–0.7)
Eosinophils Relative: 1 % (ref 0–5)
HEMATOCRIT: 37.8 % (ref 36.0–46.0)
HEMOGLOBIN: 12.2 g/dL (ref 12.0–15.0)
LYMPHS ABS: 2.9 10*3/uL (ref 0.7–4.0)
Lymphocytes Relative: 30 % (ref 12–46)
MCH: 28.9 pg (ref 26.0–34.0)
MCHC: 32.3 g/dL (ref 30.0–36.0)
MCV: 89.6 fL (ref 78.0–100.0)
MONO ABS: 0.5 10*3/uL (ref 0.1–1.0)
MONOS PCT: 5 % (ref 3–12)
Neutro Abs: 6.1 10*3/uL (ref 1.7–7.7)
Neutrophils Relative %: 64 % (ref 43–77)
Platelets: 220 10*3/uL (ref 150–400)
RBC: 4.22 MIL/uL (ref 3.87–5.11)
RDW: 13.8 % (ref 11.5–15.5)
WBC: 9.5 10*3/uL (ref 4.0–10.5)

## 2013-08-20 LAB — COMPREHENSIVE METABOLIC PANEL
ALK PHOS: 98 U/L (ref 39–117)
ALT: 11 U/L (ref 0–35)
AST: 13 U/L (ref 0–37)
Albumin: 3.9 g/dL (ref 3.5–5.2)
BILIRUBIN TOTAL: 0.4 mg/dL (ref 0.2–1.2)
BUN: 12 mg/dL (ref 6–23)
CO2: 29 mEq/L (ref 19–32)
Calcium: 8.6 mg/dL (ref 8.4–10.5)
Chloride: 104 mEq/L (ref 96–112)
Creat: 0.69 mg/dL (ref 0.50–1.10)
Glucose, Bld: 83 mg/dL (ref 70–99)
Potassium: 4.3 mEq/L (ref 3.5–5.3)
SODIUM: 138 meq/L (ref 135–145)
TOTAL PROTEIN: 6.6 g/dL (ref 6.0–8.3)

## 2013-08-20 LAB — LIPID PANEL
CHOL/HDL RATIO: 6 ratio
Cholesterol: 156 mg/dL (ref 0–200)
HDL: 26 mg/dL — ABNORMAL LOW (ref >39)
LDL CALC: 91 mg/dL (ref 0–99)
TRIGLYCERIDES: 196 mg/dL — AB (ref <150)
VLDL: 39 mg/dL (ref 0–40)

## 2013-08-20 LAB — TSH: TSH: 2.668 u[IU]/mL (ref 0.350–4.500)

## 2013-08-20 LAB — HCG, QUANTITATIVE, PREGNANCY: hCG, Beta Chain, Quant, S: <2 m[IU]/mL

## 2013-08-21 ENCOUNTER — Telehealth: Payer: Self-pay | Admitting: Adult Health

## 2013-08-21 NOTE — Telephone Encounter (Signed)
Left message to call about labs 

## 2013-08-25 ENCOUNTER — Telehealth: Payer: Self-pay | Admitting: Adult Health

## 2013-08-25 NOTE — Telephone Encounter (Addendum)
Pt aware of labs and that triglycerides high at 196 and HDL low at 26, she said PCP called meds in but she has not gotten them yet, she said PCP would take care of this.

## 2013-12-01 ENCOUNTER — Encounter (HOSPITAL_COMMUNITY): Payer: Self-pay | Admitting: Psychiatry

## 2013-12-01 ENCOUNTER — Ambulatory Visit (INDEPENDENT_AMBULATORY_CARE_PROVIDER_SITE_OTHER): Payer: BC Managed Care – PPO | Admitting: Psychiatry

## 2013-12-01 VITALS — BP 108/80 | Ht 65.0 in | Wt 225.0 lb

## 2013-12-01 DIAGNOSIS — F329 Major depressive disorder, single episode, unspecified: Secondary | ICD-10-CM

## 2013-12-01 DIAGNOSIS — F32A Depression, unspecified: Secondary | ICD-10-CM

## 2013-12-01 DIAGNOSIS — F3289 Other specified depressive episodes: Secondary | ICD-10-CM

## 2013-12-01 MED ORDER — CITALOPRAM HYDROBROMIDE 20 MG PO TABS: 20.0000 mg | ORAL_TABLET | Freq: Every day | ORAL | Status: DC

## 2013-12-01 NOTE — Progress Notes (Signed)
Patient ID: ROSINE SOLECKI, female   DOB: 17-Jun-1965, 48 y.o.   MRN: [id number] Patient ID: GEARL KIMBROUGH, female   DOB: [birth year], 48 y.o.   MRN: [id number] [facility] [address] Progress Note AROURA VASUDEVAN MRN: [id number] DOB: May 12, 1966 Age: 48 y.o.  Date: 12/01/2013 Start Time: 3:50  PM End Time: 4:15 PM  Chief Complaint: Chief Complaint  Patient presents with  . Anxiety  . Depression  . Follow-up    Subjective: I'm doing well."  This patient is a 48 year old divorced white female who lives with her 25 year old son in [place]. 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  The patient states she began dealing with depression and anxiety approximately 12 years ago. She's not entirely sure why. She has been hospitalized one time. She was on Celexa 40 mg per day and has weaned herself all the way down to 10 mg per day. She's doing well at this dosage. She's been moved to a new department at work and she really enjoys it. She was recently diagnosed with hypothyroidism and her thyroid tests have stabilized on Synthroid. She still feels tired a lot. She suffers from irritable bowel syndrome and has been flaring up lately after she had taken antibiotic for bronchitis. In general her mood is stable and she sleeps well at night.  The patient returns after about 6 months. She's actually now on Celexa 20 mg daily. She's doing well on this dosage and her mood is been stable. She is working a lot of extra hours. She's sleeping well and her energy is pretty good. She continues to have irritable bowel syndrome from and she's going to talk to her primary doctor about treatment for this. She denies any suicidal ideation or psychotic symptoms.  .  Current psychiatric medication Celexa 20 mg daily  Past psychiatric history Patient has history of depression since 2000. She had tried Prozac, Cymbalta, Zoloft and Wellbutrin. She denies any history of suicidal attempt however she  was admitted to behavioral [facility] in 2007 due to suicidal thinking to overdose on pills.  Allergies: Allergies  Allergen Reactions  . Sulfa Antibiotics Other (See Comments)    Unknown.    Medical History: Past Medical History  Diagnosis Date  . History of anemia   . Headache(784.0)   . Hearing impairment   . Depression   . Thyroid disease   . Hypertension   . Irritable bowel syndrome   . Obesity   . Missed period 08/19/2013  Headache but does not take any prescribed medication  Surgical History: No past surgical history on file. Family History: family history includes ADD / ADHD in her daughter and son; Alcohol abuse in her paternal uncle; Cancer in her maternal grandfather; Cirrhosis in her paternal uncle; Depression in her brother, brother, maternal grandmother, and sister; Seizures in her father. There is no history of Drug abuse, Dementia, Bipolar disorder, Anxiety disorder, OCD, Paranoid behavior, Schizophrenia, Sexual abuse, or Physical abuse. Reviewed and nothing has changed today.  Social history .  Her father is living in nursing home.  He suffering from metastasis lung cancer.  Mental status examination Patient is well groomed well dressed. She is pleasant and cooperative.  She maintained fair eye contact.  She described her mood is good and her affect is mood appropriate.  She denies any active or passive suicidal thoughts or homicidal thoughts.  She denies any psychotic symptoms.  Her attention and concentration is fair.  She denies any auditory  or visual hallucination.  She's alert and oriented x3.  Her insight judgment and impulse control is okay.  Lab Results:  Results for orders placed in visit on 08/19/13 (from the past 8736 hour(s))  HEMOCCULT GUIAC POC 1CARD (OFFICE)   Collection Time    08/19/13  3:20 PM      Result Value Ref Range   Fecal Occult Blood, POC Negative     Card #1 Date       Card #2 Fecal Occult Blod, POC       Card #2 Date        Card #3 Fecal Occult Blood, POC       Card #3 Date      COMPREHENSIVE METABOLIC PANEL   Collection Time    08/19/13  3:42 PM      Result Value Ref Range   Sodium 138  135 - 145 mEq/L   Potassium 4.3  3.5 - 5.3 mEq/L   Chloride 104  96 - 112 mEq/L   CO2 29  19 - 32 mEq/L   Glucose, Bld 83  70 - 99 mg/dL   BUN 12  6 - 23 mg/dL   Creat 0.69  0.50 - 1.10 mg/dL   Total Bilirubin 0.4  0.2 - 1.2 mg/dL   Alkaline Phosphatase 98  39 - 117 U/L   AST 13  0 - 37 U/L   ALT 11  0 - 35 U/L   Total Protein 6.6  6.0 - 8.3 g/dL   Albumin 3.9  3.5 - 5.2 g/dL   Calcium 8.6  8.4 - 10.5 mg/dL  TSH   Collection Time    08/19/13  3:42 PM      Result Value Ref Range   TSH 2.668  0.350 - 4.500 uIU/mL  HCG, QUANTITATIVE, PREGNANCY   Collection Time    08/19/13  3:42 PM      Result Value Ref Range   hCG, Beta Chain, Quant, S <2.0    CBC WITH DIFFERENTIAL   Collection Time    08/19/13  3:42 PM      Result Value Ref Range   WBC 9.5  4.0 - 10.5 K/uL   RBC 4.22  3.87 - 5.11 MIL/uL   Hemoglobin 12.2  12.0 - 15.0 g/dL   HCT 37.8  36.0 - 46.0 %   MCV 89.6  78.0 - 100.0 fL   MCH 28.9  26.0 - 34.0 pg   MCHC 32.3  30.0 - 36.0 g/dL   RDW 13.8  11.5 - 15.5 %   Platelets 220  150 - 400 K/uL   Neutrophils Relative % 64  43 - 77 %   Neutro Abs 6.1  1.7 - 7.7 K/uL   Lymphocytes Relative 30  12 - 46 %   Lymphs Abs 2.9  0.7 - 4.0 K/uL   Monocytes Relative 5  3 - 12 %   Monocytes Absolute 0.5  0.1 - 1.0 K/uL   Eosinophils Relative 1  0 - 5 %   Eosinophils Absolute 0.1  0.0 - 0.7 K/uL   Basophils Relative 0  0 - 1 %   Basophils Absolute 0.0  0.0 - 0.1 K/uL   Smear Review Criteria for review not met    LIPID PANEL   Collection Time    08/19/13  3:42 PM      Result Value Ref Range   Cholesterol 156  0 - 200 mg/dL   Triglycerides 196 (*) <150 mg/dL   HDL  26 (*) >39 mg/dL   Total CHOL/HDL Ratio 6.0     VLDL 39  0 - 40 mg/dL   LDL Cholesterol 91  0 - 99 mg/dL   Dr Francee Piccolo will no longer be her  PCP.  Assessment Depressive disorder NOS Axis II deferred Axis III see medical history Axis IV mild to moderate  Plan/Discussion: I took her vitals.  I reviewed CC, tobacco/med/surg Hx, meds effects/ side effects, problem list, therapies and responses as well as current situation/symptoms discussed options. Continue Celexa 20 mg every morning . She will return in 6 months  See orders and pt instructions for more details.  Medical Decision Making Problem Points:  Established problem, stable/improving (1), New problem, with no additional work-up planned (3), Review of last therapy session (1) and Review of psycho-social stressors (1) Data Points:  Review or order clinical lab tests (1) Review of medication regiment & side effects (2)  I certify that outpatient services furnished can reasonably be expected to improve the patient's condition.   Levonne Spiller, MD

## 2014-04-09 ENCOUNTER — Encounter (HOSPITAL_COMMUNITY): Payer: Self-pay | Admitting: Psychiatry

## 2014-04-12 ENCOUNTER — Encounter (HOSPITAL_COMMUNITY): Payer: Self-pay | Admitting: Psychiatry

## 2014-04-21 ENCOUNTER — Encounter (HOSPITAL_COMMUNITY): Payer: Self-pay | Admitting: Psychiatry

## 2014-06-01 ENCOUNTER — Ambulatory Visit (HOSPITAL_COMMUNITY): Payer: Self-pay | Admitting: Psychiatry

## 2014-06-02 ENCOUNTER — Ambulatory Visit (HOSPITAL_COMMUNITY): Payer: Self-pay | Admitting: Psychiatry

## 2014-06-15 ENCOUNTER — Encounter (HOSPITAL_COMMUNITY): Payer: Self-pay | Admitting: Psychiatry

## 2014-06-15 ENCOUNTER — Ambulatory Visit (INDEPENDENT_AMBULATORY_CARE_PROVIDER_SITE_OTHER): Payer: BLUE CROSS/BLUE SHIELD | Admitting: Psychiatry

## 2014-06-15 VITALS — BP 111/65 | HR 69 | Ht 65.0 in | Wt 227.8 lb

## 2014-06-15 DIAGNOSIS — F32A Depression, unspecified: Secondary | ICD-10-CM

## 2014-06-15 DIAGNOSIS — F329 Major depressive disorder, single episode, unspecified: Secondary | ICD-10-CM

## 2014-06-15 MED ORDER — CITALOPRAM HYDROBROMIDE 20 MG PO TABS: 20.0000 mg | ORAL_TABLET | Freq: Every day | ORAL | Status: DC

## 2014-06-15 NOTE — Progress Notes (Signed)
Patient ID: Joanne Frazier, female   DOB: June 21, 1965, 49 y.o.   MRN: [id number] Patient ID: Joanne Frazier, female   DOB: 1966-04-10, 49 y.o.   MRN: [id number] Patient ID: Joanne Frazier, female   DOB: 06/15/1965, 49 y.o.   MRN: [id number] [facility] [address] Progress Note Joanne Frazier MRN: [id number] DOB: 02-05-1966 Age: 49 y.o.  Date: 06/15/2014 Start Time: 3:50  PM End Time: 4:15 PM  Chief Complaint: Chief Complaint  Patient presents with  . Depression  . Follow-up    Subjective: I'm doing well."  This patient is a 49 year old divorced white female who lives with her 55 year old son in [place]. 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  The patient states she began dealing with depression and anxiety approximately 12 years ago. She's not entirely sure why. She has been hospitalized one time. She was on Celexa 40 mg per day and has weaned herself all the way down to 10 mg per day. She's doing well at this dosage. She's been moved to a new department at work and she really enjoys it. She was recently diagnosed with hypothyroidism and her thyroid tests have stabilized on Synthroid. She still feels tired a lot. She suffers from irritable bowel syndrome and has been flaring up lately after she had taken antibiotic for bronchitis. In general her mood is stable and she sleeps well at night.  The patient returns after about 6 months. She continues on Celexa 20 mg daily. Her mood is been stable and she has been working hard. Her new medication for irritable bowel is also working well for her and her health is been good. She currently is on a short-term leave for 2 weeks from work.  .  Current psychiatric medication Celexa 20 mg daily  Past psychiatric history Patient has history of depression since 2000. She had tried Prozac, Cymbalta, Zoloft and Wellbutrin. She denies any history of suicidal attempt however she was admitted to behavioral [facility] in 2007 due  to suicidal thinking to overdose on pills.  Allergies: Allergies  Allergen Reactions  . Sulfa Antibiotics Other (See Comments)    Unknown.    Medical History: Past Medical History  Diagnosis Date  . History of anemia   . Headache(784.0)   . Hearing impairment   . Depression   . Thyroid disease   . Hypertension   . Irritable bowel syndrome   . Obesity   . Missed period 08/19/2013  Headache but does not take any prescribed medication  Surgical History: No past surgical history on file. Family History: family history includes ADD / ADHD in her daughter and son; Alcohol abuse in her paternal uncle; Cancer in her maternal grandfather; Cirrhosis in her paternal uncle; Depression in her brother, brother, maternal grandmother, and sister; Seizures in her father. There is no history of Drug abuse, Dementia, Bipolar disorder, Anxiety disorder, OCD, Paranoid behavior, Schizophrenia, Sexual abuse, or Physical abuse. Reviewed and nothing has changed today.  Social history .  Her father is living in nursing home.  He suffering from metastasis lung cancer.  Mental status examination Patient is well groomed well dressed. She is pleasant and cooperative.  She maintained fair eye contact.  She described her mood is good and her affect is mood appropriate.  She denies any active or passive suicidal thoughts or homicidal thoughts.  She denies any psychotic symptoms.  Her attention and concentration is fair.  She denies any auditory or visual hallucination.  She's  alert and oriented x3.  Her insight judgment and impulse control is okay.  Lab Results:  Results for orders placed or performed in visit on 08/19/13 (from the past 8736 hour(s))  POCT occult blood stool   Collection Time: 08/19/13  3:20 PM  Result Value Ref Range   Fecal Occult Blood, POC Negative    Card #1 Date     Card #2 Fecal Occult Blod, POC     Card #2 Date     Card #3 Fecal Occult Blood, POC     Card #3 Date    Comprehensive  metabolic panel   Collection Time: 08/19/13  3:42 PM  Result Value Ref Range   Sodium 138 135 - 145 mEq/L   Potassium 4.3 3.5 - 5.3 mEq/L   Chloride 104 96 - 112 mEq/L   CO2 29 19 - 32 mEq/L   Glucose, Bld 83 70 - 99 mg/dL   BUN 12 6 - 23 mg/dL   Creat 0.69 0.50 - 1.10 mg/dL   Total Bilirubin 0.4 0.2 - 1.2 mg/dL   Alkaline Phosphatase 98 39 - 117 U/L   AST 13 0 - 37 U/L   ALT 11 0 - 35 U/L   Total Protein 6.6 6.0 - 8.3 g/dL   Albumin 3.9 3.5 - 5.2 g/dL   Calcium 8.6 8.4 - 10.5 mg/dL  TSH   Collection Time: 08/19/13  3:42 PM  Result Value Ref Range   TSH 2.668 0.350 - 4.500 uIU/mL  hCG, quantitative, pregnancy   Collection Time: 08/19/13  3:42 PM  Result Value Ref Range   hCG, Beta Chain, Quant, S <2.0 mIU/mL  CBC w/Diff   Collection Time: 08/19/13  3:42 PM  Result Value Ref Range   WBC 9.5 4.0 - 10.5 K/uL   RBC 4.22 3.87 - 5.11 MIL/uL   Hemoglobin 12.2 12.0 - 15.0 g/dL   HCT 37.8 36.0 - 46.0 %   MCV 89.6 78.0 - 100.0 fL   MCH 28.9 26.0 - 34.0 pg   MCHC 32.3 30.0 - 36.0 g/dL   RDW 13.8 11.5 - 15.5 %   Platelets 220 150 - 400 K/uL   Neutrophils Relative % 64 43 - 77 %   Neutro Abs 6.1 1.7 - 7.7 K/uL   Lymphocytes Relative 30 12 - 46 %   Lymphs Abs 2.9 0.7 - 4.0 K/uL   Monocytes Relative 5 3 - 12 %   Monocytes Absolute 0.5 0.1 - 1.0 K/uL   Eosinophils Relative 1 0 - 5 %   Eosinophils Absolute 0.1 0.0 - 0.7 K/uL   Basophils Relative 0 0 - 1 %   Basophils Absolute 0.0 0.0 - 0.1 K/uL   Smear Review Criteria for review not met   Lipid panel   Collection Time: 08/19/13  3:42 PM  Result Value Ref Range   Cholesterol 156 0 - 200 mg/dL   Triglycerides 196 (H) <150 mg/dL   HDL 26 (L) >39 mg/dL   Total CHOL/HDL Ratio 6.0 Ratio   VLDL 39 0 - 40 mg/dL   LDL Cholesterol 91 0 - 99 mg/dL   Dr Francee Piccolo will no longer be her PCP.  Assessment Depressive disorder NOS Axis II deferred Axis III see medical history Axis IV mild to moderate  Plan/Discussion: I took her  vitals.  I reviewed CC, tobacco/med/surg Hx, meds effects/ side effects, problem list, therapies and responses as well as current situation/symptoms discussed options. Continue Celexa 20 mg every morning . She will return  in 6 months  See orders and pt instructions for more details.  Medical Decision Making Problem Points:  Established problem, stable/improving (1), New problem, with no additional work-up planned (3), Review of last therapy session (1) and Review of psycho-social stressors (1) Data Points:  Review or order clinical lab tests (1) Review of medication regiment & side effects (2)  I certify that outpatient services furnished can reasonably be expected to improve the patient's condition.   Levonne Spiller, MD

## 2014-09-04 ENCOUNTER — Emergency Department (HOSPITAL_COMMUNITY)
Admission: EM | Admit: 2014-09-04 | Discharge: 2014-09-04 | Disposition: A | Discharge status: Home / Self Care | Payer: BLUE CROSS/BLUE SHIELD | Attending: Emergency Medicine | Admitting: Emergency Medicine

## 2014-09-04 ENCOUNTER — Encounter (HOSPITAL_COMMUNITY): Payer: Self-pay | Admitting: Emergency Medicine

## 2014-09-04 DIAGNOSIS — Z79899 Other long term (current) drug therapy: Secondary | ICD-10-CM | POA: Insufficient documentation

## 2014-09-04 DIAGNOSIS — I10 Essential (primary) hypertension: Secondary | ICD-10-CM | POA: Diagnosis not present

## 2014-09-04 DIAGNOSIS — K589 Irritable bowel syndrome without diarrhea: Secondary | ICD-10-CM | POA: Insufficient documentation

## 2014-09-04 DIAGNOSIS — M5432 Sciatica, left side: Secondary | ICD-10-CM

## 2014-09-04 DIAGNOSIS — H9190 Unspecified hearing loss, unspecified ear: Secondary | ICD-10-CM | POA: Diagnosis not present

## 2014-09-04 DIAGNOSIS — Z862 Personal history of diseases of the blood and blood-forming organs and certain disorders involving the immune mechanism: Secondary | ICD-10-CM | POA: Diagnosis not present

## 2014-09-04 DIAGNOSIS — Z720 Tobacco use: Secondary | ICD-10-CM | POA: Diagnosis not present

## 2014-09-04 DIAGNOSIS — M5442 Lumbago with sciatica, left side: Secondary | ICD-10-CM | POA: Insufficient documentation

## 2014-09-04 DIAGNOSIS — Z872 Personal history of diseases of the skin and subcutaneous tissue: Secondary | ICD-10-CM | POA: Insufficient documentation

## 2014-09-04 DIAGNOSIS — M549 Dorsalgia, unspecified: Secondary | ICD-10-CM | POA: Diagnosis present

## 2014-09-04 DIAGNOSIS — E079 Disorder of thyroid, unspecified: Secondary | ICD-10-CM | POA: Diagnosis not present

## 2014-09-04 DIAGNOSIS — F329 Major depressive disorder, single episode, unspecified: Secondary | ICD-10-CM | POA: Insufficient documentation

## 2014-09-04 DIAGNOSIS — E669 Obesity, unspecified: Secondary | ICD-10-CM | POA: Diagnosis not present

## 2014-09-04 MED ORDER — CYCLOBENZAPRINE HCL 10 MG PO TABS: 10.0000 mg | ORAL_TABLET | Freq: Two times a day (BID) | ORAL | Status: DC | PRN

## 2014-09-04 MED ORDER — NAPROXEN 500 MG PO TABS: 500.0000 mg | ORAL_TABLET | Freq: Two times a day (BID) | ORAL | Status: DC

## 2014-09-04 NOTE — ED Provider Notes (Signed)
CSN: [id number]     Arrival date & time 09/04/14  1740 History   First MD Initiated Contact with Patient 09/04/14 1904     Chief Complaint  Patient presents with  . Back Pain     (Consider location/radiation/quality/duration/timing/severity/associated sxs/prior Treatment) Patient is a 49 y.o. female presenting with back pain. The history is provided by the patient.  Back Pain Location:  Lumbar spine Quality:  Aching and shooting Radiates to:  L thigh Pain severity:  Severe Onset quality:  Gradual Duration:  1 month Timing:  Constant Progression:  Worsening Relieved by:  Nothing Worsened by:  Movement, bending and ambulation Associated symptoms: no bladder incontinence, no bowel incontinence and no dysuria    Abelardo Dieseleresa A Clonch is a 49 y.o. female who presents to the ED with low back pain that radiates to the left left. Her symptoms started about a month ago and have gotten worse. She states that yesterday she cut the grass and didn't have any problem until after she was finished. Then last night the pain was really bad. She did not take anything for pain. She went to bed and it eased off a little and she slept. Today the pain is worse and her left hip is hurting.   Past Medical History  Diagnosis Date  . History of anemia   . Headache(784.0)   . Hearing impairment   . Depression   . Thyroid disease   . Hypertension   . Irritable bowel syndrome   . Obesity   . Missed period 08/19/2013   History reviewed. No pertinent past surgical history. Family History  Problem Relation Age of Onset  . Depression Sister   . Depression Brother   . Depression Brother   . Alcohol abuse Paternal Uncle   . Cirrhosis Paternal Uncle   . Depression Maternal Grandmother     "Nervous Breakdown"  . Drug abuse Neg Hx   . Dementia Neg Hx   . Bipolar disorder Neg Hx   . Anxiety disorder Neg Hx   . OCD Neg Hx   . Paranoid behavior Neg Hx   . Schizophrenia Neg Hx   . Sexual abuse Neg Hx   .  Physical abuse Neg Hx   . Seizures Father   . ADD / ADHD Daughter   . ADD / ADHD Son   . Cancer Maternal Grandfather     colon   History  Substance Use Topics  . Smoking status: Current Every Day Smoker -- 0.50 packs/day for 9 years    Types: Cigarettes  . Smokeless tobacco: Never Used     Comment: 15-20 cigs a day as of 11/18/2012  . Alcohol Use: No   OB History    Gravida Para Term Preterm AB TAB SAB Ectopic Multiple Living   3 2   1  1   2      Review of Systems  Gastrointestinal: Negative for bowel incontinence.  Genitourinary: Negative for bladder incontinence and dysuria.  Musculoskeletal: Positive for back pain.      Allergies  Sulfa antibiotics  Home Medications   Prior to Admission medications   Medication Sig Start Date End Date Taking? Authorizing Provider  citalopram (CELEXA) 20 MG tablet Take 1 tablet (20 mg total) by mouth daily. 06/15/14 06/15/15  Myrlene Brokereborah R Ross, MD  cyclobenzaprine (FLEXERIL) 10 MG tablet Take 1 tablet (10 mg total) by mouth 2 (two) times daily as needed for muscle spasms. 09/04/14   Hope Orlene OchM Neese, NP  fenofibrate micronized (  LOFIBRA) 134 MG capsule Take 134 mg by mouth daily before breakfast.  10/26/13   Historical Provider, MD  levothyroxine (SYNTHROID, LEVOTHROID) 50 MCG tablet 50 mcg daily before breakfast.  02/17/13   Historical Provider, MD  LOTRONEX 0.5 MG tablet Take 0.5 mg by mouth 2 (two) times daily. 04/02/14   Historical Provider, MD  metoprolol succinate (TOPROL-XL) 50 MG 24 hr tablet Take 50 mg by mouth daily.  04/26/13   Historical Provider, MD  montelukast (SINGULAIR) 10 MG tablet Take 10 mg by mouth daily. 05/09/14   Historical Provider, MD  naproxen (NAPROSYN) 500 MG tablet Take 1 tablet (500 mg total) by mouth 2 (two) times daily. 09/04/14   Hope Orlene Och, NP   BP 152/81 mmHg  Pulse 71  Temp(Src) 98.7 F (37.1 C) (Oral)  Resp 14  Ht  (1.676 m)  Wt 226 lb (102.513 kg)  BMI 36.49 kg/m2  SpO2 100% Physical Exam    Constitutional: She is oriented to person, place, and time. She appears well-developed and well-nourished. No distress.  HENT:  Head: Normocephalic and atraumatic.  Right Ear: Tympanic membrane normal.  Left Ear: Tympanic membrane normal.  Nose: Nose normal.  Mouth/Throat: Uvula is midline, oropharynx is clear and moist and mucous membranes are normal.  Eyes: Conjunctivae and EOM are normal.  Neck: Normal range of motion. Neck supple.  Cardiovascular: Normal rate and regular rhythm.   Pulmonary/Chest: Effort normal. She has no wheezes. She has no rales.  Abdominal: Bowel sounds are normal.  Musculoskeletal: Normal range of motion.       Lumbar back: She exhibits tenderness, pain and spasm. She exhibits normal pulse.       Back:  Neurological: She is alert and oriented to person, place, and time. She has normal strength. No cranial nerve deficit or sensory deficit. Gait normal.  Reflex Scores:      Bicep reflexes are 2+ on the right side and 2+ on the left side.      Brachioradialis reflexes are 2+ on the right side and 2+ on the left side.      Patellar reflexes are 2+ on the right side and 2+ on the left side.      Achilles reflexes are 2+ on the right side and 2+ on the left side. Skin: Skin is warm and dry.  Psychiatric: She has a normal mood and affect. Her behavior is normal.  Nursing note and vitals reviewed.   ED Course  Procedures   MDM  49 y.o. female with low back pain that radiates to the left buttock and leg x one month. Will treat for sciatica and she is to follow up with her PCP. Discussed with the patient clinical findings and plan of care. All questioned fully answered. She will return if any problems arise.  Final diagnoses:  Sciatica, left       [facility], NP 09/05/14 2225  Benjiman Core, MD 09/05/14 2329

## 2014-09-04 NOTE — ED Notes (Signed)
Pt reports low back pain with radiation down her L leg. Pt states she has had back pain x 1 month but symptoms have become worse over the past few days.

## 2014-09-08 ENCOUNTER — Encounter (HOSPITAL_COMMUNITY): Payer: Self-pay | Admitting: Psychiatry

## 2014-09-08 ENCOUNTER — Ambulatory Visit (INDEPENDENT_AMBULATORY_CARE_PROVIDER_SITE_OTHER): Payer: BLUE CROSS/BLUE SHIELD | Admitting: Psychiatry

## 2014-09-08 VITALS — BP 120/57 | HR 74 | Ht 66.0 in | Wt 227.2 lb

## 2014-09-08 DIAGNOSIS — F329 Major depressive disorder, single episode, unspecified: Secondary | ICD-10-CM

## 2014-09-08 DIAGNOSIS — F32A Depression, unspecified: Secondary | ICD-10-CM

## 2014-09-08 MED ORDER — CITALOPRAM HYDROBROMIDE 20 MG PO TABS: ORAL_TABLET | ORAL | Status: DC

## 2014-09-08 NOTE — Progress Notes (Signed)
Patient ID: Joanne Frazier, female   DOB: March 29, 1966, 49 y.o.   MRN: [id number] Patient ID: Joanne Frazier, female   DOB: 11-Jul-1965, 49 y.o.   MRN: [id number] Patient ID: Joanne Frazier, female   DOB: 08-Sep-1965, 49 y.o.   MRN: [id number] Patient ID: Joanne Frazier, female   DOB: 1965-08-06, 49 y.o.   MRN: [id number] [facility] [id number] Progress Note Joanne Frazier MRN: [id number] DOB: 09-26-1965 Age: 49 y.o.  Date: 09/08/2014   Chief Complaint: Chief Complaint  Patient presents with  . Depression  . Follow-up    Subjective: "I've been more depressed"  This patient is a 49 year old divorced white female who lives with her 47 year old son in [place]. 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  The patient states she began dealing with depression and anxiety approximately 12 years ago. She's not entirely sure why. She has been hospitalized one time. She was on Celexa 40 mg per day and has weaned herself all the way down to 20 mg per day. She's doing well at this dosage. She's been moved to a new department at work and she really enjoys it. She was recently diagnosed with hypothyroidism and her thyroid tests have stabilized on Synthroid. She still feels tired a lot. She suffers from irritable bowel syndrome and has been flaring up lately after she had taken antibiotic for bronchitis. In general her mood is stable and she sleeps well at night.  The patient returns as a work in today. She states that she does still doesn't feel right and feels more depressed. There are number of factors at work here however. She is going into menopause and has not had a period in 4 months. She has hot flashes and chills at night. She's also hurt her back and has sciatica. She has to stand a lot at work which exacerbates it. She also has a history of hypothyroidism which hasn't been checked recently. Finally she broke up with a boyfriend recently all she claims this doesn't really bother  her. She has no energy and feels law and sad but denies suicidal ideation. I strongly suggested she go to her primary doctor to check on her thyroid and maybe a gynecologist to check out her hormone levels related to menopause as well as get some treatment for her back pain. In the interim we can increase her Celexa but she only wants to go to 30 mg  .  Current psychiatric medication Celexa 20 mg daily  Past psychiatric history Patient has history of depression since 2000. She had tried Prozac, Cymbalta, Zoloft and Wellbutrin. She denies any history of suicidal attempt however [facility] to suicidal thinking to overdose on pills.  Allergies: Allergies  Allergen Reactions  . Sulfa Antibiotics Other (See Comments)    Unknown.    Medical History: Past Medical History  Diagnosis Date  . History of anemia   . Headache(784.0)   . Hearing impairment   . Depression   . Thyroid disease   . Hypertension   . Irritable bowel syndrome   . Obesity   . Missed period 08/19/2013  Headache but does not take any prescribed medication  Surgical History: History reviewed. No pertinent past surgical history. Family History: family history includes ADD / ADHD in her daughter and son; Alcohol abuse in her paternal uncle; Cancer in her maternal grandfather; Cirrhosis in her paternal uncle; Depression in her brother, brother, maternal grandmother,  and sister; Seizures in her father. There is no history of Drug abuse, Dementia, Bipolar disorder, Anxiety disorder, OCD, Paranoid behavior, Schizophrenia, Sexual abuse, or Physical abuse. Reviewed and nothing has changed today.  Social history .  Her father is living in nursing home.  He suffering from metastasis lung cancer.  Mental status examination Patient is well groomed well dressed. She is pleasant and cooperative.  She maintained fair eye contact.  She described her mood as depressed and her affect is  somewhat blunted.  She denies any active or passive suicidal thoughts or homicidal thoughts.  She denies any psychotic symptoms.  Her attention and concentration is fair.  She denies any auditory or visual hallucination.  She's alert and oriented x3.  Her insight judgment and impulse control is okay.  Lab Results:  No results found for this or any previous visit (from the past 8736 hour(s)). Dr Mohammed KindleFerguerson will no longer be her PCP.  Assessment Depressive disorder NOS Axis II deferred Axis III see medical history Axis IV mild to moderate  Plan/Discussion: I took her vitals.  I reviewed CC, tobacco/med/surg Hx, meds effects/ side effects, problem list, therapies and responses as well as current situation/symptoms discussed options. Continue Celexa but increase to 30 mg daily at bedtime . She will return in 4 weeks. In the interim she will try to get in to see her primary doctor and gynecologist to deal with her other medical issues See orders and pt instructions for more details.  Medical Decision Making Problem Points:  Established problem, stable/improving (1), New problem, with no additional work-up planned (3), Review of last therapy session (1) and Review of psycho-social stressors (1) Data Points:  Review or order clinical lab tests (1) Review of medication regiment & side effects (2)  I certify that outpatient services furnished can reasonably be expected to improve the patient's condition.   Joanne Frazier, Joanne Tatum, MD

## 2014-10-06 ENCOUNTER — Ambulatory Visit (HOSPITAL_COMMUNITY): Payer: Self-pay | Admitting: Psychiatry

## 2014-10-25 ENCOUNTER — Encounter (HOSPITAL_COMMUNITY): Payer: Self-pay | Admitting: Psychiatry

## 2014-10-25 ENCOUNTER — Ambulatory Visit (INDEPENDENT_AMBULATORY_CARE_PROVIDER_SITE_OTHER): Payer: BLUE CROSS/BLUE SHIELD | Admitting: Psychiatry

## 2014-10-25 VITALS — BP 120/80 | Wt 228.0 lb

## 2014-10-25 DIAGNOSIS — F329 Major depressive disorder, single episode, unspecified: Secondary | ICD-10-CM

## 2014-10-25 DIAGNOSIS — F32A Depression, unspecified: Secondary | ICD-10-CM

## 2014-10-25 MED ORDER — CITALOPRAM HYDROBROMIDE 20 MG PO TABS: 20.0000 mg | ORAL_TABLET | Freq: Every day | ORAL | Status: DC

## 2014-10-25 NOTE — Progress Notes (Signed)
Patient ID: Abelardo Dieseleresa A Letson, female   DOB: 11/05/1965, 49 y.o.   MRN: [id number] Patient ID: Abelardo Dieseleresa A Quam, female   DOB: 11/05/1965, 49 y.o.   MRN: [id number] Patient ID: Abelardo Dieseleresa A Fata, female   DOB: 11/05/1965, 49 y.o.   MRN: [id number] Patient ID: Abelardo Dieseleresa A Velez, female   DOB: 11/05/1965, 49 y.o.   MRN: [id number] Patient ID: Abelardo Dieseleresa A Alonso, female   DOB: 11/05/1965, 49 y.o.   MRN: [id number] [facility] [id number] Progress Note Abelardo Dieseleresa A Mcclurg MRN: [id number] DOB: 11/05/1965 Age: 49 y.o.  Date: 10/25/2014   Chief Complaint: Chief Complaint  Patient presents with  . Depression    Subjective: "I've been more depressed"  This patient is a 49 year old divorced white female who lives with her 49 year old son in [place]. 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  The patient states she began dealing with depression and anxiety approximately 12 years ago. She's not entirely sure why. She has been hospitalized one time. She was on Celexa 40 mg per day and has weaned herself all the way down to 20 mg per day. She's doing well at this dosage. She's been moved to a new department at work and she really enjoys it. She was recently diagnosed with hypothyroidism and her thyroid tests have stabilized on Synthroid. She still feels tired a lot. She suffers from irritable bowel syndrome and has been flaring up lately after she had taken antibiotic for bronchitis. In general her mood is stable and she sleeps well at night.  The patient returns after 3 months. She is generally doing well. She feels like she's through with menopause and her hot flashes have stopped. Her depression and anxiety have improved . She never did increase the Celexa and continues on 20 mg daily and she thinks this is effective. She denies suicidal ideation .  Current psychiatric medication Celexa 20 mg daily  Past psychiatric history Patient has history of depression since 2000. She had tried Prozac,  Cymbalta, Zoloft and Wellbutrin. She denies any history of suicidal attempt however [facility] due to suicidal thinking to overdose on pills.  Allergies: Allergies  Allergen Reactions  . Sulfa Antibiotics Other (See Comments)    Unknown.    Medical History: Past Medical History  Diagnosis Date  . History of anemia   . Headache(784.0)   . Hearing impairment   . Depression   . Thyroid disease   . Hypertension   . Irritable bowel syndrome   . Obesity   . Missed period 08/19/2013  Headache but does not take any prescribed medication  Surgical History: No past surgical history on file. Family History: family history includes ADD / ADHD in her daughter and son; Alcohol abuse in her paternal uncle; Cancer in her maternal grandfather; Cirrhosis in her paternal uncle; Depression in her brother, brother, maternal grandmother, and sister; Seizures in her father. There is no history of Drug abuse, Dementia, Bipolar disorder, Anxiety disorder, OCD, Paranoid behavior, Schizophrenia, Sexual abuse, or Physical abuse. Reviewed and nothing has changed today.  Social history .  Her father is living in nursing home.  He suffering from metastasis lung cancer.  Mental status examination Patient is well groomed well dressed. She is pleasant and cooperative.  She maintained fair eye contact.  She described her mood as good and her affect is bright  She denies any active or passive suicidal thoughts or homicidal thoughts.  She denies any psychotic  symptoms.  Her attention and concentration is fair.  She denies any auditory or visual hallucination.  She's alert and oriented x3.  Her insight judgment and impulse control is okay.  Lab Results:  No results found for this or any previous visit (from the past 8736 hour(s)). Dr Mohammed KindleFerguerson will no longer be her PCP.  Assessment Depressive disorder NOS Axis II deferred Axis III see medical history Axis IV mild to  moderate  Plan/Discussion: I took her vitals.  I reviewed CC, tobacco/med/surg Hx, meds effects/ side effects, problem list, therapies and responses as well as current situation/symptoms discussed options. Continue Celexa 20 mg daily for depression . She will return in 4 months. See orders and pt instructions for more details.  Medical Decision Making Problem Points:  Established problem, stable/improving (1), New problem, with no additional work-up planned (3), Review of last therapy session (1) and Review of psycho-social stressors (1) Data Points:  Review or order clinical lab tests (1) Review of medication regiment & side effects (2)  I certify that outpatient services furnished can reasonably be expected to improve the patient's condition.   Diannia RuderOSS, DEBORAH, MD

## 2014-10-31 ENCOUNTER — Other Ambulatory Visit (HOSPITAL_COMMUNITY): Payer: Self-pay | Admitting: Psychiatry

## 2014-12-14 ENCOUNTER — Ambulatory Visit (HOSPITAL_COMMUNITY): Payer: Self-pay | Admitting: Psychiatry

## 2015-02-25 ENCOUNTER — Encounter (HOSPITAL_COMMUNITY): Payer: Self-pay | Admitting: Psychiatry

## 2015-02-25 ENCOUNTER — Ambulatory Visit (INDEPENDENT_AMBULATORY_CARE_PROVIDER_SITE_OTHER): Payer: BLUE CROSS/BLUE SHIELD | Admitting: Psychiatry

## 2015-02-25 VITALS — BP 138/90 | Ht 66.0 in | Wt 222.0 lb

## 2015-02-25 DIAGNOSIS — F329 Major depressive disorder, single episode, unspecified: Secondary | ICD-10-CM

## 2015-02-25 DIAGNOSIS — F32A Depression, unspecified: Secondary | ICD-10-CM

## 2015-02-25 MED ORDER — CITALOPRAM HYDROBROMIDE 20 MG PO TABS: 20.0000 mg | ORAL_TABLET | Freq: Every day | ORAL | Status: DC

## 2015-02-25 NOTE — Progress Notes (Signed)
Patient ID: Joanne Frazier, female   DOB: 1965/06/27, 49 y.o.   MRN: [id number] Patient ID: Joanne Frazier, female   DOB: [birth year], 49 y.o.   MRN: [id number] Patient ID: Joanne Frazier, female   DOB: 1965-11-22, 49 y.o.   MRN: [id number] Patient ID: Joanne Frazier, female   DOB: 1965/11/25, 49 y.o.   MRN: [id number] Patient ID: Joanne Frazier, female   DOB: 1966-05-01, 49 y.o.   MRN: [id number] Patient ID: Joanne Frazier, female   DOB: [birth year], 49 y.o.   MRN: [id number] [facility] [id number] Progress Note Joanne Frazier MRN: [id number] DOB: 1966/04/11 Age: 49 y.o.  Date: 02/25/2015   Chief Complaint: Chief Complaint  Patient presents with  . Depression  . Follow-up    Subjective: "I am tired all the time"  This patient is a 49 year old divorced white female who lives with her 53 year old son in [place]. 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  The patient states she began dealing with depression and anxiety approximately 12 years ago. She's not entirely sure why. She has been hospitalized one time. She was on Celexa 40 mg per day and has weaned herself all the way down to 20 mg per day. She's doing well at this dosage. She's been moved to a new department at work and she really enjoys it. She was recently diagnosed with hypothyroidism and her thyroid tests have stabilized on Synthroid. She still feels tired a lot. She suffers from irritable bowel syndrome and has been flaring up lately after she had taken antibiotic for bronchitis. In general her mood is stable and she sleeps well at night.  The patient returns after 49 months. She is generally doing well. 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 She's going to look for something else. Her mood is very good and she denies being depressed. She states that she feels tired sluggish and unable to concentrate. I told her to speak to her family physician about the metoprolol because I think this could  be the culprit. I suggested she get off this and on to another medicine for blood pressure first and if this doesn't help we can look into his stimulant medication  Current psychiatric medication Celexa 20 mg daily  Past psychiatric history Patient has history of depression since 2000. She had tried Prozac, Cymbalta, Zoloft and Wellbutrin. She denies any history of suicidal attempt however [facility] due to suicidal thinking to overdose on pills.  Allergies: Allergies  Allergen Reactions  . Sulfa Antibiotics Other (See Comments)    Unknown.    Medical History: Past Medical History  Diagnosis Date  . History of anemia   . Headache(784.0)   . Hearing impairment   . Depression   . Thyroid disease   . Hypertension   . Irritable bowel syndrome   . Obesity   . Missed period 08/19/2013  Headache but does not take any prescribed medication  Surgical History: No past surgical history on file. Family History: family history includes ADD / ADHD in her daughter and son; Alcohol abuse in her paternal uncle; Cancer in her maternal grandfather; Cirrhosis in her paternal uncle; Depression in her brother, brother, maternal grandmother, and sister; Seizures in her father. There is no history of Drug abuse, Dementia, Bipolar disorder, Anxiety disorder, OCD, Paranoid behavior, Schizophrenia, Sexual abuse, or Physical abuse. Reviewed and nothing has changed today.  Social history .  Her father is living in nursing home.  He suffering from metastasis lung cancer.  Mental status examination Patient is well groomed . She is pleasant and cooperative.  She maintained fair eye contact.  She described her mood as good and her affect is bright  She denies any active or passive suicidal thoughts or homicidal thoughts.  She denies any psychotic symptoms.  Her attention and concentration is fair.  She denies any auditory or visual hallucination.  She's alert and  oriented x3.  Her insight judgment and impulse control is okay.  Lab Results:  No results found for this or any previous visit (from the past 8736 hour(s)). Dr Mohammed Kindle will no longer be her PCP.  Assessment Depressive disorder NOS Axis II deferred Axis III see medical history Axis IV mild to moderate  Plan/Discussion: I took her vitals.  I reviewed CC, tobacco/med/surg Hx, meds effects/ side effects, problem list, therapies and responses as well as current situation/symptoms discussed options. Continue Celexa 20 mg daily for depression . She will return in 2 months See orders and pt instructions for more details.  Medical Decision Making Problem Points:  Established problem, stable/improving (1), New problem, with no additional work-up planned (3), Review of last therapy session (1) and Review of psycho-social stressors (1) Data Points:  Review or order clinical lab tests (1) Review of medication regiment & side effects (2)  I certify that outpatient services furnished can reasonably be expected to improve the patient's condition.   Diannia Ruder, MD

## 2015-04-27 ENCOUNTER — Ambulatory Visit (HOSPITAL_COMMUNITY): Payer: Self-pay | Admitting: Psychiatry

## 2015-05-02 ENCOUNTER — Encounter (HOSPITAL_COMMUNITY): Payer: Self-pay | Admitting: Psychiatry

## 2015-11-21 ENCOUNTER — Other Ambulatory Visit (HOSPITAL_COMMUNITY): Payer: Self-pay | Admitting: Psychiatry

## 2015-11-21 ENCOUNTER — Telehealth (HOSPITAL_COMMUNITY): Payer: Self-pay | Admitting: *Deleted

## 2015-11-21 NOTE — Telephone Encounter (Signed)
Called home number on file to sch f/u appt due to pt last appt with provider being 02/2015. Pt pharmacy requesting refills. Lmtcb on home number and office number provided. Called pt mobile number on file and per message umber is d/c.

## 2016-01-05 ENCOUNTER — Other Ambulatory Visit (HOSPITAL_COMMUNITY): Payer: Self-pay | Admitting: Psychiatry

## 2016-02-06 ENCOUNTER — Encounter (HOSPITAL_COMMUNITY): Payer: Self-pay | Admitting: Psychiatry

## 2016-02-06 ENCOUNTER — Ambulatory Visit (INDEPENDENT_AMBULATORY_CARE_PROVIDER_SITE_OTHER): Payer: BLUE CROSS/BLUE SHIELD | Admitting: Psychiatry

## 2016-02-06 VITALS — BP 140/90 | Ht 68.0 in | Wt 234.0 lb

## 2016-02-06 DIAGNOSIS — F329 Major depressive disorder, single episode, unspecified: Secondary | ICD-10-CM

## 2016-02-06 DIAGNOSIS — F32A Depression, unspecified: Secondary | ICD-10-CM

## 2016-02-06 MED ORDER — CITALOPRAM HYDROBROMIDE 20 MG PO TABS: 20.0000 mg | ORAL_TABLET | Freq: Every day | ORAL | 2 refills | Status: DC

## 2016-02-06 NOTE — Progress Notes (Signed)
Patient ID: Joanne Frazier, female   DOB: [birth year], 50 y.o.   MRN: [id number] Patient ID: Joanne Frazier, female   DOB: [birth year], 50 y.o.   MRN: [id number] Patient ID: Joanne Frazier, female   DOB: [birth year], 50 y.o.   MRN: [id number] Patient ID: Joanne Frazier, female   DOB: [birth year], 50 y.o.   MRN: [id number] Patient ID: Joanne Frazier, female   DOB: [birth year], 50 y.o.   MRN: [id number] Patient ID: Joanne Frazier, female   DOB: [birth year], 50 y.o.   MRN: [id number] [facility] [id number] Progress Note Joanne Frazier MRN: [id number] DOB: [birth year] Age: 50 y.o.  Date: 02/06/2016   Chief Complaint: Chief Complaint  Patient presents with  . Depression  . Anxiety  . Follow-up    Subjective: I'm doing okay" "  This patient is a 50 year-old divorced white female who lives with her 50 year old son in [place]. 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  The patient states she began dealing with depression and anxiety approximately 12 years ago. She's not entirely sure why. She has been hospitalized one time. She was on Celexa 40 mg per day and has weaned herself all the way down to 20 mg per day. She's doing well at this dosage. She's been moved to a new department at work and she really enjoys it. She was recently diagnosed with hypothyroidism and her thyroid tests have stabilized on Synthroid. She still feels tired a lot. She suffers from irritable bowel syndrome and has been flaring up lately after she had taken antibiotic for bronchitis. In general her mood is stable and she sleeps well at night.  The patient returns after a long absence. She was last seen about 11 months ago. She had enough refills on her Celexa so she had not returned. She states that she is doing well although she had to stop her Synthroid and her cholesterol medicine because she was falling and felt unsteady. She has a plan with her primary care next month to see what can be done about  replacing these medications. General she is doing okay except for menopausal hot flashes. Her mood is been good and she is enjoying her job. Current psychiatric medication Celexa 20 mg daily  Past psychiatric history Patient has history of depression since 2000. She had tried Prozac, Cymbalta, Zoloft and Wellbutrin. She denies any history of suicidal attempt however [facility] to suicidal thinking to overdose on pills.  Allergies: Allergies  Allergen Reactions  . Sulfa Antibiotics Other (See Comments)    Unknown.    Medical History: Past Medical History:  Diagnosis Date  . Depression   . Headache(784.0)   . Hearing impairment   . History of anemia   . Hypertension   . Irritable bowel syndrome   . Missed period 08/19/2013  . Obesity   . Thyroid disease   Headache but does not take any prescribed medication  Surgical History: No past surgical history on file. Family History: family history includes ADD / ADHD in her daughter and son; Alcohol abuse in her paternal uncle; Cancer in her maternal grandfather; Cirrhosis in her paternal uncle; Depression in her brother, brother, maternal grandmother, and sister; Seizures in her father. Reviewed and nothing has changed today.  Social history .  Her father is living in nursing home.  He suffering from metastasis lung cancer.  Mental status examination Patient is well groomed . She  is pleasant and cooperative.  She maintained fair eye contact.  She described her mood as good and her affect is bright  She denies any active or passive suicidal thoughts or homicidal thoughts.  She denies any psychotic symptoms.  Her attention and concentration is fair.  She denies any auditory or visual hallucination.  She's alert and oriented x3.  Her insight judgment and impulse control is okay.  Lab Results:  No results found for this or any previous visit (from the past 8736 hour(s)). Dr Mohammed Kindle will no  longer be her PCP.  Assessment Depressive disorder NOS Axis II deferred Axis III see medical history Axis IV mild to moderate  Plan/Discussion: I took her vitals.  I reviewed CC, tobacco/med/surg Hx, meds effects/ side effects, problem list, therapies and responses as well as current situation/symptoms discussed options. Continue Celexa 20 mg daily for depression . She will return in 6 months See orders and pt instructions for more details.  Medical Decision Making Problem Points:  Established problem, stable/improving (1), New problem, with no additional work-up planned (3), Review of last therapy session (1) and Review of psycho-social stressors (1) Data Points:  Review or order clinical lab tests (1) Review of medication regiment & side effects (2)  I certify that outpatient services furnished can reasonably be expected to improve the patient's condition.   Diannia Ruder, MD

## 2016-04-01 ENCOUNTER — Encounter (HOSPITAL_COMMUNITY): Payer: Self-pay | Admitting: *Deleted

## 2016-04-01 ENCOUNTER — Emergency Department (HOSPITAL_COMMUNITY)
Admission: EM | Admit: 2016-04-01 | Discharge: 2016-04-02 | Disposition: A | Discharge status: Home / Self Care | Payer: BLUE CROSS/BLUE SHIELD | Attending: Emergency Medicine | Admitting: Emergency Medicine

## 2016-04-01 DIAGNOSIS — Z79899 Other long term (current) drug therapy: Secondary | ICD-10-CM | POA: Diagnosis not present

## 2016-04-01 DIAGNOSIS — F17210 Nicotine dependence, cigarettes, uncomplicated: Secondary | ICD-10-CM | POA: Insufficient documentation

## 2016-04-01 DIAGNOSIS — Z791 Long term (current) use of non-steroidal anti-inflammatories (NSAID): Secondary | ICD-10-CM | POA: Insufficient documentation

## 2016-04-01 DIAGNOSIS — I10 Essential (primary) hypertension: Secondary | ICD-10-CM | POA: Diagnosis not present

## 2016-04-01 DIAGNOSIS — R079 Chest pain, unspecified: Secondary | ICD-10-CM | POA: Diagnosis present

## 2016-04-01 DIAGNOSIS — R0789 Other chest pain: Secondary | ICD-10-CM | POA: Diagnosis not present

## 2016-04-01 NOTE — ED Triage Notes (Signed)
Pt c/o left side chest pain that started x 2 days ago and states the pain radiates to her left arm; pt states the pain increases with movement and the pain started Friday while at work

## 2016-04-01 NOTE — ED Provider Notes (Signed)
TIME SEEN: 11:49 PM  CHIEF COMPLAINT: Chest pain  By signing my name below, I, Christy Sartorius, attest that this documentation has been prepared under the direction and in the presence of Maksymilian Mabey N Blayton Huttner, DO . Electronically Signed: Christy Sartorius, Scribe. 04/02/2016. 12:36 AM.  HPI:   Joanne Frazier is a 50 y.o. female history of HTN, HLD, tobacco abuse who presents to the Emergency Department complaining of waxing and waning left sided chest pain that started 2 days ago, but is worse tonight.  2 days ago pt lifted her arm up and felt a minor sharp pain.  She states her pain grew worse with time and is never completely gone. She reports its worse with deep breaths and bending over.   She also notes a tingling in her left arm and left arm pain as well as light headedness that has now resolved.  Pt denies SOB, nausea, vomiting, dizziness, diaphoresis, fever and cough.   Pt denies history of diabetes, cardiac stress test, cardiac catheterization.  No history of PE, DVT, exogenous estrogen use, fracture, surgery, trauma, hospitalization, prolonged travel. No lower extremity swelling or pain. No calf tenderness.   ROS: See HPI Constitutional: no fever  Eyes: no drainage  ENT: no runny nose   Cardiovascular:  Positive chest pain  Resp: no SOB  GI: no vomiting GU: no dysuria Integumentary: no rash  Allergy: no hives  Musculoskeletal: no leg swelling  Neurological: no slurred speech ROS otherwise negative  PAST MEDICAL HISTORY/PAST SURGICAL HISTORY:  Past Medical History:  Diagnosis Date  . Depression   . Headache(784.0)   . Hearing impairment   . History of anemia   . Hypertension   . Irritable bowel syndrome   . Missed period 08/19/2013  . Obesity   . Thyroid disease     MEDICATIONS:  Prior to Admission medications   Medication Sig Start Date End Date Taking? Authorizing Provider  citalopram (CELEXA) 20 MG tablet Take 1 tablet (20 mg total) by mouth daily. 02/06/16    Myrlene Broker, MD  cyclobenzaprine (FLEXERIL) 10 MG tablet Take 1 tablet (10 mg total) by mouth 2 (two) times daily as needed for muscle spasms. 09/04/14   Hope Orlene Och, NP  fenofibrate micronized (LOFIBRA) 134 MG capsule Take 134 mg by mouth daily before breakfast.  10/26/13   Historical Provider, MD  LOTRONEX 0.5 MG tablet Take 0.5 mg by mouth 2 (two) times daily. 04/02/14   Historical Provider, MD  metoprolol succinate (TOPROL-XL) 50 MG 24 hr tablet Take 50 mg by mouth daily.  04/26/13   Historical Provider, MD  naproxen (NAPROSYN) 500 MG tablet Take 1 tablet (500 mg total) by mouth 2 (two) times daily. 09/04/14   Hope Orlene Och, NP    ALLERGIES:  Allergies  Allergen Reactions  . Sulfa Antibiotics Other (See Comments)    Unknown.     SOCIAL HISTORY:  Social History  Substance Use Topics  . Smoking status: Current Every Day Smoker    Packs/day: 0.50    Years: 9.00    Types: Cigarettes  . Smokeless tobacco: Never Used     Comment: 15-20 cigs a day as of 11/18/2012  . Alcohol use No    FAMILY HISTORY: Family History  Problem Relation Age of Onset  . Depression Sister   . Depression Brother   . Depression Brother   . Alcohol abuse Paternal Uncle   . Cirrhosis Paternal Uncle   . Depression Maternal Grandmother     "  Nervous Breakdown"  . Seizures Father   . ADD / ADHD Daughter   . ADD / ADHD Son   . Cancer Maternal Grandfather     colon  . Drug abuse Neg Hx   . Dementia Neg Hx   . Bipolar disorder Neg Hx   . Anxiety disorder Neg Hx   . OCD Neg Hx   . Paranoid behavior Neg Hx   . Schizophrenia Neg Hx   . Sexual abuse Neg Hx   . Physical abuse Neg Hx     EXAM: BP 151/88 (BP Location: Right Arm)   Pulse 73   Temp 97.5 F (36.4 C) (Oral)   Resp 17   Ht 5\' 6"  (1.676 m)   Wt 232 lb (105.2 kg)   SpO2 100%   BMI 37.45 kg/m  CONSTITUTIONAL: Alert and oriented and responds appropriately to questions. Well-appearing; well-nourished HEAD: Normocephalic EYES: Conjunctivae  clear, PERRL ENT: normal nose; no rhinorrhea; moist mucous membranes NECK: Supple, no meningismus, no LAD  CARD: RRR; S1 and S2 appreciated; no murmurs, no clicks, no rubs, no gallops CHEST:  Nontender to palpation without crepitus, ecchymosis or deformity.  No rash or other lesions noted RESP: Normal chest excursion without splinting or tachypnea; breath sounds clear and equal bilaterally; no wheezes, no rhonchi, no rales, no hypoxia or respiratory distress, speaking full sentences ABD/GI: Normal bowel sounds; non-distended; soft, non-tender, no rebound, no guarding, no peritoneal signs BACK:  The back appears normal and is non-tender to palpation, there is no CVA tenderness EXT: Normal ROM in all joints; non-tender to palpation; no edema; normal capillary refill; no cyanosis, no calf tenderness or swelling    SKIN: Normal color for age and race; warm; no rash NEURO: Moves all extremities equally, sensation to light touch intact diffusely, cranial nerves II through XII intact PSYCH: The patient's mood and manner are appropriate. Grooming and personal hygiene are appropriate.  MEDICAL DECISION MAKING: Patient here with chest pain. Seems to be atypical in nature but she does have risk factors for ACS. It is not exertional but rather pleuritic. Worse with lying on her left side and bending over. No other associated symptoms. Pain is been constant for the past 2 days and has never completely resolved. EKG shows no ischemic abnormality. Will obtain cardiac labs including a d-dimer, chest x-ray. We'll give aspirin, nitroglycerin and reassess.  ED PROGRESS: Patient's labs are unremarkable. Troponin negative. D-dimer negative. Chest x-ray shows no infiltrate, edema or pneumothorax. No change in her pain with nitroglycerin. Reports it is worse with movement but not exertion especially movement of her arm, twisting. I suspect it is musculoskeletal in nature. Will treat with Toradol. I feel she is safe to be  discharged home. Given she has a constant pain for 2 days but has not completely resolved and her troponin is negative, my suspicion for ACS is very low. I do not feel she needs serial troponins given constant pain. She is comfortable with plan for discharge home with outpatient follow-up with her PCP. Discussed return precautions. She verbalized understanding.   At this time, I do not feel there is any life-threatening condition present. I have reviewed and discussed all results (EKG, imaging, lab, urine as appropriate), exam findings with patient/family. I have reviewed nursing notes and appropriate previous records.  I feel the patient is safe to be discharged home without further emergent workup and can continue workup as an outpatient as needed. Discussed usual and customary return precautions. Patient/family verbalize understanding and  are comfortable with this plan.  Outpatient follow-up has been provided. All questions have been answered.     EKG Interpretation  Date/Time:  Sunday April 01 2016 23:41:27 EDT Ventricular Rate:  75 PR Interval:    QRS Duration: 110 QT Interval:  402 QTC Calculation: 449 R Axis:   53 Text Interpretation:  Sinus rhythm Anteroseptal infarct, age indeterminate No old tracing to compare Confirmed by Kurtis Anastasia,  DO, Hayzen Lorenson ([id number]) on 04/02/2016 12:11:22 AM        I personally performed the services described in this documentation, which was scribed in my presence. The recorded information has been reviewed and is accurate.    Layla MawKristen N Cady Hafen, DO 04/02/16 [phone]

## 2016-04-02 ENCOUNTER — Emergency Department (HOSPITAL_COMMUNITY): Payer: BLUE CROSS/BLUE SHIELD

## 2016-04-02 LAB — BASIC METABOLIC PANEL
ANION GAP: 9 (ref 5–15)
BUN: 17 mg/dL (ref 6–20)
CALCIUM: 9.5 mg/dL (ref 8.9–10.3)
CO2: 28 mmol/L (ref 22–32)
Chloride: 103 mmol/L (ref 101–111)
Creatinine, Ser: 0.9 mg/dL (ref 0.44–1.00)
GFR calc Af Amer: >60 mL/min (ref >60)
GLUCOSE: 100 mg/dL — AB (ref 65–99)
POTASSIUM: 3.8 mmol/L (ref 3.5–5.1)
SODIUM: 140 mmol/L (ref 135–145)

## 2016-04-02 LAB — CBC WITH DIFFERENTIAL/PLATELET
Basophils Absolute: 0 K/uL (ref 0.0–0.1)
Basophils Relative: 0 %
Eosinophils Absolute: 0.1 K/uL (ref 0.0–0.7)
Eosinophils Relative: 1 %
HCT: 40.8 % (ref 36.0–46.0)
Hemoglobin: 13.5 g/dL (ref 12.0–15.0)
Lymphocytes Relative: 39 %
Lymphs Abs: 4.2 K/uL — ABNORMAL HIGH (ref 0.7–4.0)
MCH: 31.3 pg (ref 26.0–34.0)
MCHC: 33.1 g/dL (ref 30.0–36.0)
MCV: 94.4 fL (ref 78.0–100.0)
Monocytes Absolute: 0.6 K/uL (ref 0.1–1.0)
Monocytes Relative: 5 %
Neutro Abs: 5.7 K/uL (ref 1.7–7.7)
Neutrophils Relative %: 55 %
Platelets: 219 K/uL (ref 150–400)
RBC: 4.32 MIL/uL (ref 3.87–5.11)
RDW: 12.8 % (ref 11.5–15.5)
WBC: 10.7 K/uL — ABNORMAL HIGH (ref 4.0–10.5)

## 2016-04-02 LAB — TROPONIN I

## 2016-04-02 LAB — D-DIMER, QUANTITATIVE: D-Dimer, Quant: 0.33 ug{FEU}/mL (ref 0.00–0.50)

## 2016-04-02 MED ORDER — NITROGLYCERIN 0.4 MG SL SUBL
0.4000 mg | SUBLINGUAL_TABLET | SUBLINGUAL | Status: AC | PRN
  Administered 2016-04-02 (×3): 0.4 mg via SUBLINGUAL
  Filled 2016-04-02: qty 1

## 2016-04-02 MED ORDER — KETOROLAC TROMETHAMINE 30 MG/ML IJ SOLN
30.0000 mg | Freq: Once | INTRAMUSCULAR | Status: AC
  Administered 2016-04-02: 30 mg via INTRAVENOUS
  Filled 2016-04-02: qty 1

## 2016-04-02 MED ORDER — IBUPROFEN 800 MG PO TABS: 800.0000 mg | ORAL_TABLET | Freq: Three times a day (TID) | ORAL | 0 refills | Status: DC | PRN

## 2016-04-02 MED ORDER — ASPIRIN 81 MG PO CHEW
324.0000 mg | CHEWABLE_TABLET | Freq: Once | ORAL | Status: AC
  Administered 2016-04-02: 324 mg via ORAL
  Filled 2016-04-02: qty 4

## 2016-04-02 MED ORDER — HYDROCODONE-ACETAMINOPHEN 5-325 MG PO TABS: 1.0000 | ORAL_TABLET | Freq: Four times a day (QID) | ORAL | 0 refills | Status: DC | PRN

## 2016-04-02 NOTE — ED Notes (Signed)
Pt states she is no longer feeling pain with deep breathing after 1 nitro

## 2016-04-02 NOTE — ED Notes (Signed)
Pt states understanding of care given and follow up instructions.  Pt alert, oriented x 4, ambulated from ED with steady gait

## 2016-07-26 ENCOUNTER — Telehealth (HOSPITAL_COMMUNITY): Payer: Self-pay | Admitting: *Deleted

## 2016-07-26 NOTE — Telephone Encounter (Signed)
Pt called stating she is Sleep more, don't want to go to work and is going end up losing her job. Per pt, her Depression is getting worse. Per pt she thinks it's her new BP med that she just started but she do not want her depression to get worse or she will end up losing her job. Pt have f/u on 08-08-2016. Per pt she would like to know what Dr. Tenny Crawoss suggests. Pt new BP med name is Bystolic.  Pt number is [phone].

## 2016-07-26 NOTE — Telephone Encounter (Signed)
Its possible this is the cause but she seh to discuss stopping it with the physician who prescribed it

## 2016-07-27 NOTE — Telephone Encounter (Signed)
Called pt and informed her with what Dr. Tenny Crawoss stated and pt verbalized understanding.

## 2016-08-08 ENCOUNTER — Ambulatory Visit (HOSPITAL_COMMUNITY): Payer: BLUE CROSS/BLUE SHIELD | Admitting: Psychiatry

## 2016-08-14 ENCOUNTER — Ambulatory Visit (INDEPENDENT_AMBULATORY_CARE_PROVIDER_SITE_OTHER): Payer: BLUE CROSS/BLUE SHIELD | Admitting: Psychiatry

## 2016-08-14 ENCOUNTER — Encounter (HOSPITAL_COMMUNITY): Payer: Self-pay | Admitting: Psychiatry

## 2016-08-14 VITALS — BP 150/90 | HR 70 | Ht 66.0 in | Wt 237.4 lb

## 2016-08-14 DIAGNOSIS — F321 Major depressive disorder, single episode, moderate: Secondary | ICD-10-CM | POA: Diagnosis not present

## 2016-08-14 DIAGNOSIS — Z882 Allergy status to sulfonamides status: Secondary | ICD-10-CM

## 2016-08-14 MED ORDER — CITALOPRAM HYDROBROMIDE 20 MG PO TABS: 20.0000 mg | ORAL_TABLET | Freq: Every day | ORAL | 2 refills | Status: DC

## 2016-08-14 NOTE — Progress Notes (Signed)
Patient ID: MARABETH MELLAND, female   DOB: Jan 08, 1966, 51 y.o.   MRN: [id number] Patient ID: SHELLYANN WANDREY, female   DOB: Oct 03, 1965, 51 y.o.   MRN: [id number] Patient ID: TYRESHIA INGMAN, female   DOB: 05-Apr-1966, 51 y.o.   MRN: [id number] Patient ID: TIRZAH FROSS, female   DOB: 16-Nov-1965, 51 y.o.   MRN: [id number] Patient ID: BRITNE BORELLI, female   DOB: 1965-09-18, 51 y.o.   MRN: [id number] Patient ID: PEBBLES ZEIDERS, female   DOB: 1966-06-01, 51 y.o.   MRN: [id number] [facility] [id number] Progress Note KJIRSTEN BLOODGOOD MRN: [id number] DOB: 1965-09-10 Age: 51 y.o.  Date: 08/14/2016   Chief Complaint: Chief Complaint  Patient presents with  . Depression  . Anxiety  . Follow-up    Subjective: I'm doing okay" "  This patient is a 51 year-old divorced white female who lives with her 80 year old son in [place]. 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  The patient states she began dealing with depression and anxiety approximately 12 years ago. She's not entirely sure why. She has been hospitalized one time. She was on Celexa 40 mg per day and has weaned herself all the way down to 20 mg per day. She's doing well at this dosage. She's been moved to a new department at work and she really enjoys it. She was recently diagnosed with hypothyroidism and her thyroid tests have stabilized on Synthroid. She still feels tired a lot. She suffers from irritable bowel syndrome and has been flaring up lately after she had taken antibiotic for bronchitis. In general her mood is stable and she sleeps well at night.  The patient returns after 6 months. She states that in terms of her mood she is doing well. She is sleeping well. She is frustrated because her blood pressure is not been well controlled and she is now on the third medication. Everything seems to cause side effects but she's finally doing okay on bystolic. She really wants to quit smoking and is smoking a pack a day. She's  considering Chantix but is worried about increased depression. She cannot tolerate Wellbutrin because it caused anxiety. I suggested she try that Chantix cautiously and if it made her feel worse she was to stop it. I also suggested NicoDerm patches Current psychiatric medication Celexa 20 mg daily  Past psychiatric history Patient has history of depression since 2000. She had tried Prozac, Cymbalta, Zoloft and Wellbutrin. She denies any history of suicidal attempt however [facility] due to suicidal thinking to overdose on pills.  Allergies: Allergies  Allergen Reactions  . Sulfa Antibiotics Other (See Comments)    Unknown.    Medical History: Past Medical History:  Diagnosis Date  . Depression   . Headache(784.0)   . Hearing impairment   . History of anemia   . Hypertension   . Irritable bowel syndrome   . Missed period 08/19/2013  . Obesity   . Thyroid disease   Headache but does not take any prescribed medication  Surgical History: No past surgical history on file. Family History: family history includes ADD / ADHD in her daughter and son; Alcohol abuse in her paternal uncle; Cancer in her maternal grandfather; Cirrhosis in her paternal uncle; Depression in her brother, brother, maternal grandmother, and sister; Seizures in her father. Reviewed and nothing has changed today.  Social history .  Her father is living in nursing home.  He suffering from metastasis lung cancer.  Mental status examination Patient is well groomed . She is pleasant and cooperative.  She maintained fair eye contact.  She described her mood as good and her affect is bright  She denies any active or passive suicidal thoughts or homicidal thoughts.  She denies any psychotic symptoms.  Her attention and concentration is fair.  She denies any auditory or visual hallucination.  She's alert and oriented x3.  Her insight judgment and impulse control is okay.  Lab  Results:  Results for orders placed or performed during the hospital encounter of 04/01/16 (from the past 8736 hour(s))  CBC with Differential   Collection Time: 04/01/16 11:54 PM  Result Value Ref Range   WBC 10.7 (H) 4.0 - 10.5 K/uL   RBC 4.32 3.87 - 5.11 MIL/uL   Hemoglobin 13.5 12.0 - 15.0 g/dL   HCT 40.940.8 81.136.0 - 91.446.0 %   MCV 94.4 78.0 - 100.0 fL   MCH 31.3 26.0 - 34.0 pg   MCHC 33.1 30.0 - 36.0 g/dL   RDW 78.212.8 95.611.5 - 21.315.5 %   Platelets 219 150 - 400 K/uL   Neutrophils Relative % 55 %   Neutro Abs 5.7 1.7 - 7.7 K/uL   Lymphocytes Relative 39 %   Lymphs Abs 4.2 (H) 0.7 - 4.0 K/uL   Monocytes Relative 5 %   Monocytes Absolute 0.6 0.1 - 1.0 K/uL   Eosinophils Relative 1 %   Eosinophils Absolute 0.1 0.0 - 0.7 K/uL   Basophils Relative 0 %   Basophils Absolute 0.0 0.0 - 0.1 K/uL  Basic metabolic panel   Collection Time: 04/01/16 11:54 PM  Result Value Ref Range   Sodium 140 135 - 145 mmol/L   Potassium 3.8 3.5 - 5.1 mmol/L   Chloride 103 101 - 111 mmol/L   CO2 28 22 - 32 mmol/L   Glucose, Bld 100 (H) 65 - 99 mg/dL   BUN 17 6 - 20 mg/dL   Creatinine, Ser 0.860.90 0.44 - 1.00 mg/dL   Calcium 9.5 8.9 - 57.810.3 mg/dL   GFR calc non Af Amer >60 >60 mL/min   GFR calc Af Amer >60 >60 mL/min   Anion gap 9 5 - 15  Troponin I   Collection Time: 04/01/16 11:54 PM  Result Value Ref Range   Troponin I <0.03 <0.03 ng/mL  D-dimer, quantitative (not at [facility])   Collection Time: 04/01/16 11:54 PM  Result Value Ref Range   D-Dimer, Quant 0.33 0.00 - 0.50 ug/mL-FEU   Dr Mohammed KindleFerguerson will no longer be her PCP.  Assessment Depressive disorder NOS Axis II deferred Axis III see medical history Axis IV mild to moderate  Plan/Discussion: I took her vitals.  I reviewed CC, tobacco/med/surg Hx, meds effects/ side effects, problem list, therapies and responses as well as current situation/symptoms discussed options. Continue Celexa 20 mg daily for depression . She will return in 6 months See orders  and pt instructions for more details.  Medical Decision Making Problem Points:  Established problem, stable/improving (1), New problem, with no additional work-up planned (3), Review of last therapy session (1) and Review of psycho-social stressors (1) Data Points:  Review or order clinical lab tests (1) Review of medication regiment & side effects (2)  I certify that outpatient services furnished can reasonably be expected to improve the patient's condition.   Diannia RuderOSS, Jame Morrell, MD

## 2017-02-14 ENCOUNTER — Ambulatory Visit (HOSPITAL_COMMUNITY): Payer: Self-pay | Admitting: Psychiatry

## 2017-03-05 ENCOUNTER — Emergency Department (HOSPITAL_COMMUNITY)
Admission: EM | Admit: 2017-03-05 | Discharge: 2017-03-05 | Disposition: A | Discharge status: Home / Self Care | Payer: Self-pay | Attending: Emergency Medicine | Admitting: Emergency Medicine

## 2017-03-05 ENCOUNTER — Emergency Department (HOSPITAL_COMMUNITY): Payer: Self-pay

## 2017-03-05 ENCOUNTER — Encounter (HOSPITAL_COMMUNITY): Payer: Self-pay | Admitting: *Deleted

## 2017-03-05 DIAGNOSIS — I10 Essential (primary) hypertension: Secondary | ICD-10-CM | POA: Insufficient documentation

## 2017-03-05 DIAGNOSIS — J019 Acute sinusitis, unspecified: Secondary | ICD-10-CM | POA: Insufficient documentation

## 2017-03-05 DIAGNOSIS — J209 Acute bronchitis, unspecified: Secondary | ICD-10-CM | POA: Insufficient documentation

## 2017-03-05 DIAGNOSIS — Z79899 Other long term (current) drug therapy: Secondary | ICD-10-CM | POA: Insufficient documentation

## 2017-03-05 DIAGNOSIS — J40 Bronchitis, not specified as acute or chronic: Secondary | ICD-10-CM

## 2017-03-05 DIAGNOSIS — F17210 Nicotine dependence, cigarettes, uncomplicated: Secondary | ICD-10-CM | POA: Insufficient documentation

## 2017-03-05 DIAGNOSIS — J329 Chronic sinusitis, unspecified: Secondary | ICD-10-CM

## 2017-03-05 MED ORDER — GUAIFENESIN-DM 100-10 MG/5ML PO SYRP
5.0000 mL | ORAL_SOLUTION | Freq: Once | ORAL | Status: AC
  Administered 2017-03-05: 5 mL via ORAL
  Filled 2017-03-05: qty 5

## 2017-03-05 MED ORDER — PSEUDOEPHEDRINE HCL 60 MG PO TABS
60.0000 mg | ORAL_TABLET | Freq: Once | ORAL | Status: AC
  Administered 2017-03-05: 60 mg via ORAL
  Filled 2017-03-05: qty 1

## 2017-03-05 MED ORDER — DEXAMETHASONE SODIUM PHOSPHATE 10 MG/ML IJ SOLN
10.0000 mg | Freq: Once | INTRAMUSCULAR | Status: AC
  Administered 2017-03-05: 10 mg via INTRAMUSCULAR
  Filled 2017-03-05: qty 1

## 2017-03-05 MED ORDER — PROMETHAZINE-DM 6.25-15 MG/5ML PO SYRP: 5.0000 mL | ORAL_SOLUTION | Freq: Four times a day (QID) | ORAL | 0 refills | Status: DC | PRN

## 2017-03-05 MED ORDER — CEPHALEXIN 500 MG PO CAPS: 500.0000 mg | ORAL_CAPSULE | Freq: Four times a day (QID) | ORAL | 0 refills | Status: DC

## 2017-03-05 MED ORDER — CEPHALEXIN 500 MG PO CAPS
500.0000 mg | ORAL_CAPSULE | Freq: Once | ORAL | Status: AC
  Administered 2017-03-05: 500 mg via ORAL
  Filled 2017-03-05: qty 1

## 2017-03-05 MED ORDER — ONDANSETRON HCL 4 MG PO TABS
4.0000 mg | ORAL_TABLET | Freq: Once | ORAL | Status: AC
  Administered 2017-03-05: 4 mg via ORAL
  Filled 2017-03-05: qty 1

## 2017-03-05 NOTE — Discharge Instructions (Signed)
Your Xray is negative for pneumonia or acute problem. Your exam suggest sinusitis and bronchitis. Please increase fluids. Use keflex, decadron and sudafed daily. Use promethazine DM for cough every 6 hours as needed.

## 2017-03-05 NOTE — ED Provider Notes (Signed)
[facility]-EMERGENCY DEPT Provider Note   CSN: [id number] Arrival date & time: 03/05/17  1204     History   Chief Complaint Chief Complaint  Patient presents with  . Cough    HPI Joanne Frazier is a 51 y.o. female.  Pt is a smoker.    URI   This is a new problem. The current episode started more than 2 days ago. The problem has been gradually worsening. There has been no fever. Associated symptoms include congestion, headaches, rhinorrhea, sneezing and cough. Pertinent negatives include no chest pain, no abdominal pain, no diarrhea, no vomiting, no dysuria, no neck pain and no wheezing. Treatments tried: cough drop, cold and flu tabs.    Past Medical History:  Diagnosis Date  . Depression   . Headache(784.0)   . Hearing impairment   . History of anemia   . Hypertension   . Irritable bowel syndrome   . Missed period 08/19/2013  . Obesity   . Thyroid disease     Patient Active Problem List   Diagnosis Date Noted  . Missed period 08/19/2013  . Decreased libido 08/18/2012  . Depression 08/21/2011    History reviewed. No pertinent surgical history.  OB History    Gravida Para Term Preterm AB Living   SAB TAB Ectopic Multiple Live Births   1       2       Home Medications    Prior to Admission medications   Medication Sig Start Date End Date Taking? Authorizing Provider  citalopram (CELEXA) 20 MG tablet Take 1 tablet (20 mg total) by mouth daily. 08/14/16  Yes Myrlene Broker, MD  Nebivolol HCl 20 MG TABS Take 20 mg by mouth. 07/20/16  Yes [provider]    Family History Family History  Problem Relation Age of Onset  . Depression Sister   . Depression Brother   . Depression Brother   . Alcohol abuse Paternal Uncle   . Cirrhosis Paternal Uncle   . Depression Maternal Grandmother        "Nervous Breakdown"  . Seizures Father   . ADD / ADHD Daughter   . ADD / ADHD Son   . Cancer Maternal Grandfather        colon  . Drug abuse Neg  Hx   . Dementia Neg Hx   . Bipolar disorder Neg Hx   . Anxiety disorder Neg Hx   . OCD Neg Hx   . Paranoid behavior Neg Hx   . Schizophrenia Neg Hx   . Sexual abuse Neg Hx   . Physical abuse Neg Hx     Social History Social History  Substance Use Topics  . Smoking status: Current Every Day Smoker    Packs/day: 0.50    Years: 9.00    Types: Cigarettes  . Smokeless tobacco: Never Used     Comment: 15-20 cigs a day as of 11/18/2012  . Alcohol use No     Allergies   Sulfa antibiotics   Review of Systems Review of Systems  Constitutional: Negative for activity change.       All ROS Neg except as noted in HPI  HENT: Positive for congestion, rhinorrhea and sneezing. Negative for nosebleeds.   Eyes: Negative for photophobia and discharge.  Respiratory: Positive for cough. Negative for shortness of breath and wheezing.   Cardiovascular: Negative for chest pain and palpitations.  Gastrointestinal: Negative for abdominal pain, blood  in stool, diarrhea and vomiting.  Genitourinary: Negative for dysuria, frequency and hematuria.  Musculoskeletal: Negative for arthralgias, back pain and neck pain.  Skin: Negative.   Neurological: Positive for headaches. Negative for dizziness, seizures and speech difficulty.  Psychiatric/Behavioral: Negative for confusion and hallucinations.     Physical Exam Updated Vital Signs BP (!) 154/61   Pulse 67   Temp 98.6 F (37 C) (Oral)   Resp 18   Ht  (1.676 m)   Wt 109.8 kg (242 lb)   SpO2 95%   BMI 39.06 kg/m   Physical Exam  Constitutional: She is oriented to person, place, and time. She appears well-developed and well-nourished.  Non-toxic appearance.  HENT:  Head: Normocephalic.  Right Ear: Tympanic membrane and external ear normal.  Left Ear: Tympanic membrane and external ear normal.  Nasal congestion. Mild pain to percussion of sinuses  Eyes: Pupils are equal, round, and reactive to light. EOM and lids are normal.  Neck:  Normal range of motion. Neck supple. Carotid bruit is not present.  Cardiovascular: Normal rate, regular rhythm, normal heart sounds, intact distal pulses and normal pulses.   Pulmonary/Chest: Breath sounds normal. No respiratory distress. She has no wheezes. She has no rales.  Course breath sounds. Symmetrical rise and fall of the chest.  Abdominal: Soft. Bowel sounds are normal. There is no tenderness. There is no guarding.  Musculoskeletal: Normal range of motion.  Lymphadenopathy:       Head (right side): No submandibular adenopathy present.       Head (left side): No submandibular adenopathy present.    She has no cervical adenopathy.  Neurological: She is alert and oriented to person, place, and time. She has normal strength. No cranial nerve deficit or sensory deficit.  Skin: Skin is warm and dry.  Psychiatric: She has a normal mood and affect. Her speech is normal.  Nursing note and vitals reviewed.    ED Treatments / Results  Labs (all labs ordered are listed, but only abnormal results are displayed) Labs Reviewed - No data to display  EKG  EKG Interpretation None       Radiology No results found.  Procedures Procedures (including critical care time)  Medications Ordered in ED Medications - No data to display   Initial Impression / Assessment and Plan / ED Course  I have reviewed the triage vital signs and the nursing notes.  Pertinent labs & imaging results that were available during my care of the patient were reviewed by me and considered in my medical decision making (see chart for details).      Final Clinical Impressions(s) / ED Diagnoses MDM Chest xray negative for acute problem. Exam suggest sinusitis and bronchitis.  Pt to increase fluids. Wash hands frequently. Rx for Promethazine DM and keflex given to the patient. Pt to use sudafed for congestion and cough. Pt will return to the ED if any changes or problem.   Final diagnoses:  Bronchitis    Sinusitis, unspecified chronicity, unspecified location    New Prescriptions New Prescriptions   No medications on file     Duayne Cal 03/05/17 1343    Donnetta Hutching, MD 03/07/17 1517

## 2017-03-05 NOTE — ED Triage Notes (Addendum)
Pt c/o non productive cough and sinus congestion x 2 days after mowing her grass. Denies fever. Pt has been using OTC sinus medication at home without relief.

## 2017-03-06 NOTE — ED Notes (Signed)
Pt says pharmacy won't fill keflex because pt is allergic to it.  Notified Dr. Adriana Simas.  [facility] called in at [facility] per Dr. Adriana Simas.

## 2017-05-10 ENCOUNTER — Ambulatory Visit (INDEPENDENT_AMBULATORY_CARE_PROVIDER_SITE_OTHER): Payer: Self-pay | Admitting: Psychiatry

## 2017-05-10 ENCOUNTER — Telehealth (HOSPITAL_COMMUNITY): Payer: Self-pay | Admitting: Psychiatry

## 2017-05-10 ENCOUNTER — Encounter (HOSPITAL_COMMUNITY): Payer: Self-pay | Admitting: Psychiatry

## 2017-05-10 VITALS — BP 148/82 | HR 102 | Ht 66.0 in | Wt 235.0 lb

## 2017-05-10 DIAGNOSIS — R450 Nervousness: Secondary | ICD-10-CM

## 2017-05-10 DIAGNOSIS — F419 Anxiety disorder, unspecified: Secondary | ICD-10-CM

## 2017-05-10 DIAGNOSIS — R45851 Suicidal ideations: Secondary | ICD-10-CM

## 2017-05-10 DIAGNOSIS — M549 Dorsalgia, unspecified: Secondary | ICD-10-CM

## 2017-05-10 DIAGNOSIS — F321 Major depressive disorder, single episode, moderate: Secondary | ICD-10-CM

## 2017-05-10 DIAGNOSIS — Z818 Family history of other mental and behavioral disorders: Secondary | ICD-10-CM

## 2017-05-10 DIAGNOSIS — F17210 Nicotine dependence, cigarettes, uncomplicated: Secondary | ICD-10-CM

## 2017-05-10 DIAGNOSIS — Z811 Family history of alcohol abuse and dependence: Secondary | ICD-10-CM

## 2017-05-10 MED ORDER — CITALOPRAM HYDROBROMIDE 20 MG PO TABS: 20.0000 mg | ORAL_TABLET | Freq: Every day | ORAL | 2 refills | Status: DC

## 2017-05-10 NOTE — Progress Notes (Signed)
[place] MD/PA/NP OP Progress Note  05/10/2017 10:45 AM SCOTLYNN NOYES  MRN:  [id number]  Chief Complaint:  Chief Complaint    Depression; Anxiety; Follow-up     HPI: This patient is a 51 year-old divorced white female who lives with her 8 year old son in [place]. 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 She's not entirely sure why. She has been hospitalized one time. She was on Celexa 40 mg per day and has weaned herself all the way down to 20 mg per day.  She returns after 8 months.  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  She was out of job for a couple of months and fell behind on all her bills including car payment and rent.  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  She is not been back to her primary doctor and is on no blood pressure medication her blood pressure is somewhat high.  She still had refills on the Celexa and she states that it helps her be able to function without serious depression.  She was very worried about finances and even has had some suicidal ideation she does not want to lose everything she owns.  However she claims she will not act on it.  She cannot afford any therapy right now.  I have encouraged her  apply for affordable health care and/or to the [facility] care program. Visit Diagnosis:    ICD-10-CM   1. 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 Depression   . Headache(784.0)   . Hearing impairment   . History of anemia   . Hypertension   . Irritable bowel syndrome   . Missed period 08/19/2013  . Obesity   . Thyroid disease    History reviewed. No  pertinent surgical history.  Family Psychiatric History: See below  Family History:  Family History  Problem Relation Age of Onset  . Depression Sister   . Depression Brother   . Depression Brother   . Alcohol abuse Paternal Uncle   . Cirrhosis Paternal Uncle   . Depression Maternal Grandmother        "Nervous Breakdown"  . Seizures Father   . ADD / ADHD Daughter   . ADD / ADHD Son   . Cancer Maternal Grandfather        colon  . Drug abuse Neg Hx   . Dementia Neg Hx   . Bipolar disorder Neg Hx   . Anxiety disorder Neg Hx   . OCD Neg Hx   . Paranoid behavior Neg Hx   . Schizophrenia Neg Hx   . Sexual abuse Neg Hx   . Physical abuse Neg Hx     Social History:  Social History   Socioeconomic History  . Marital status: Divorced    Spouse name: None  . Number of children: None  . Years of education: None  . Highest education level: None  Social Needs  . Financial resource strain: None  . Food insecurity - worry: None  . Food insecurity - inability: None  . Transportation needs - medical: None  .  Transportation needs - non-medical: None  Occupational History  . None  Tobacco Use  . Smoking status: Current Every Day Smoker    Packs/day: 0.50    Years: 9.00    Pack years: 4.50    Types: Cigarettes  . Smokeless tobacco: Never Used  . Tobacco comment: 15-20 cigs a day as of 11/18/2012  Substance and Sexual Activity  . Alcohol use: No  . Drug use: No  . Sexual activity: Yes    Birth control/protection: None  Other Topics Concern  . None  Social History Narrative  . None    Allergies:  Allergies  Allergen Reactions  . Sulfa Antibiotics Other (See Comments)    Unknown.     Metabolic Disorder Labs: No results found for: HGBA1C, MPG No results found for: PROLACTIN Lab Results  Component Value Date   CHOL 156 08/19/2013   TRIG 196 (H) 08/19/2013   HDL 26 (L) 08/19/2013   CHOLHDL 6.0 08/19/2013   VLDL 39 08/19/2013   LDLCALC 91 08/19/2013   Lab  Results  Component Value Date   TSH 2.668 08/19/2013    Therapeutic Level Labs: No results found for: LITHIUM No results found for: VALPROATE No components found for:  CBMZ  Current Medications: Current Outpatient Medications  Medication Sig Dispense Refill  . cephALEXin (KEFLEX) 500 MG capsule Take 1 capsule (500 mg total) by mouth 4 (four) times daily. 20 capsule 0  . citalopram (CELEXA) 20 MG tablet Take 1 tablet (20 mg total) by mouth daily. 90 tablet 2  . Nebivolol HCl 20 MG TABS Take 20 mg by mouth.    . promethazine-dextromethorphan (PROMETHAZINE-DM) 6.25-15 MG/5ML syrup Take 5 mLs by mouth 4 (four) times daily as needed for cough. 118 mL 0   No current facility-administered medications for this visit.      Musculoskeletal: Strength & Muscle Tone: within normal limits Gait & Station: normal Patient leans: N/A  Psychiatric Specialty Exam: Review of Systems  Musculoskeletal: Positive for back pain.  Psychiatric/Behavioral: Positive for depression and suicidal ideas. The patient is nervous/anxious.   All other systems reviewed and are negative.   Blood pressure (!) 148/82, pulse (!) 102, height '5\' 6"'  (1.676 m), weight 235 lb (106.6 kg), SpO2 97 %.Body mass index is 37.93 kg/m.  General Appearance: Casual, Neat and Well Groomed  Eye Contact:  Good  Speech:  Clear and Coherent  Volume:  Decreased  Mood:  Anxious and Depressed  Affect:  Constricted and Tearful  Thought Process:  Goal Directed  Orientation:  Full (Time, Place, and Person)  Thought Content: Rumination   Suicidal Thoughts:  Yes.  without intent/plan  Homicidal Thoughts:  No  Memory:  Immediate;   Good Recent;   Good Remote;   Fair  Judgement:  Fair  Insight:  Fair  Psychomotor Activity:  Decreased  Concentration:  Concentration: Good and Attention Span: Good  Recall:  Good  Fund of Knowledge: Good  Language: Good  Akathisia:  No  Handed:  Right  AIMS (if indicated): not done  Assets:   Communication Skills Desire for Improvement Resilience Social Support Talents/Skills  ADL's:  Intact  Cognition: WNL  Sleep:  Good   Screenings:   Assessment and Plan: This patient is a 51 year old female with a history of depression.  She is going through a difficult time with significant financial issues and loss of work.  She has now regained a new job and is trying to get back on her feet.  She gets  overwhelmed at times and even has suicidal ideation but made it clear that she would not act on this.  She cannot afford coming here very often right now.  I urged her to work on getting some sort of health coverage.  She does agree to continue the Celexa 20 mg daily and return to see me in 3 months or call sooner if she feels worse.   Levonne Spiller, MD 05/10/2017, 10:45 AM

## 2017-08-08 ENCOUNTER — Encounter (HOSPITAL_COMMUNITY): Payer: Self-pay | Admitting: Psychiatry

## 2017-08-08 ENCOUNTER — Ambulatory Visit (HOSPITAL_COMMUNITY): Payer: Self-pay | Admitting: Psychiatry

## 2017-08-08 VITALS — BP 139/81 | HR 80 | Ht 66.0 in | Wt 236.0 lb

## 2017-08-08 DIAGNOSIS — Z811 Family history of alcohol abuse and dependence: Secondary | ICD-10-CM

## 2017-08-08 DIAGNOSIS — F17210 Nicotine dependence, cigarettes, uncomplicated: Secondary | ICD-10-CM

## 2017-08-08 DIAGNOSIS — Z818 Family history of other mental and behavioral disorders: Secondary | ICD-10-CM

## 2017-08-08 DIAGNOSIS — Z560 Unemployment, unspecified: Secondary | ICD-10-CM

## 2017-08-08 DIAGNOSIS — F321 Major depressive disorder, single episode, moderate: Secondary | ICD-10-CM

## 2017-08-08 MED ORDER — CITALOPRAM HYDROBROMIDE 20 MG PO TABS: 20.0000 mg | ORAL_TABLET | Freq: Every day | ORAL | 2 refills | Status: DC

## 2017-08-08 NOTE — Progress Notes (Signed)
[facility] MD/PA/NP OP Progress Note  08/08/2017 10:42 AM Joanne Frazier  MRN:  [id number]  Chief Complaint:  Chief Complaint    Depression; Follow-up     HPI: This patient is a 52year-old divorced white female who lives with her daughter and 2 grandchildren in [place]. 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  The patient states she began dealing with depression and anxiety approximately 12 years ago. She's not entirely sure why. She has been hospitalized one time. She was on Celexa 40 mg per day and has weaned herself all the way down to 20 mg per day.  Patient returns after 3 months.  She is still trying to find work through a [employer] agency.  She has an interview to go into a job at [employer].  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  She has been told by several jobs that they can hire her because of this.  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  She is currently staying with her daughter and enjoying the time there.  She denies being significantly depressed or anxious and her energy is pretty good. Visit Diagnosis:    ICD-10-CM   1. 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 Depression   . Headache(784.0)   . Hearing impairment   . History of anemia   . Hypertension   . Irritable bowel syndrome   . Missed period 08/19/2013  . Obesity   . Thyroid disease    History reviewed. No pertinent surgical history.  Family Psychiatric History: See below  Family History:  Family History  Problem Relation Age of Onset  . Depression Sister   . Depression Brother   . Depression Brother   . Alcohol abuse Paternal Uncle   . Cirrhosis Paternal Uncle   . Depression Maternal Grandmother        "Nervous  Breakdown"  . Seizures Father   . ADD / ADHD Daughter   . ADD / ADHD Son   . Cancer Maternal Grandfather        colon  . Drug abuse Neg Hx   . Dementia Neg Hx   . Bipolar disorder Neg Hx   . Anxiety disorder Neg Hx   . OCD Neg Hx   . Paranoid behavior Neg Hx   . Schizophrenia Neg Hx   . Sexual abuse Neg Hx   . Physical abuse Neg Hx     Social History:  Social History   Socioeconomic History  . Marital status: Divorced    Spouse name: None  . Number of children: None  . Years of education: None  . Highest education level: None  Social Needs  . Financial resource strain: None  . Food insecurity - worry: None  . Food insecurity - inability: None  . Transportation needs - medical: None  . Transportation needs - non-medical: None  Occupational History  . None  Tobacco Use  . Smoking status: Current Every Day Smoker    Packs/day: 0.50    Years: 9.00    Pack years: 4.50    Types: Cigarettes  . Smokeless tobacco: Never Used  . Tobacco comment: 15-20 cigs a day as of 11/18/2012  Substance and Sexual Activity  .  Alcohol use: No  . Drug use: No  . Sexual activity: Yes    Birth control/protection: None  Other Topics Concern  . None  Social History Narrative  . None    Allergies:  Allergies  Allergen Reactions  . Sulfa Antibiotics Other (See Comments)    Unknown.     Metabolic Disorder Labs: No results found for: HGBA1C, MPG No results found for: PROLACTIN Lab Results  Component Value Date   CHOL 156 08/19/2013   TRIG 196 (H) 08/19/2013   HDL 26 (L) 08/19/2013   CHOLHDL 6.0 08/19/2013   VLDL 39 08/19/2013   LDLCALC 91 08/19/2013   Lab Results  Component Value Date   TSH 2.668 08/19/2013    Therapeutic Level Labs: No results found for: LITHIUM No results found for: VALPROATE No components found for:  CBMZ  Current Medications: Current Outpatient Medications  Medication Sig Dispense Refill  . citalopram (CELEXA) 20 MG tablet Take 1 tablet (20 mg  total) by mouth daily. 90 tablet 2   No current facility-administered medications for this visit.      Musculoskeletal: Strength & Muscle Tone: within normal limits Gait & Station: normal Patient leans: N/A  Psychiatric Specialty Exam: Review of Systems  HENT: Positive for hearing loss.   All other systems reviewed and are negative.   Blood pressure 139/81, pulse 80, height 5\' 6"  (1.676 m), weight 236 lb (107 kg), SpO2 96 %.Body mass index is 38.09 kg/m.  General Appearance: Casual and Fairly Groomed  Eye Contact:  Good  Speech:  Clear and Coherent  Volume:  Normal  Mood:  Anxious  Affect:  Congruent  Thought Process:  Goal Directed  Orientation:  Full (Time, Place, and Person)  Thought Content: Rumination   Suicidal Thoughts:  No  Homicidal Thoughts:  No  Memory:  Immediate;   Good Recent;   Good Remote;   Good  Judgement:  Good  Insight:  Fair  Psychomotor Activity:  Normal  Concentration:  Concentration: Good and Attention Span: Good  Recall:  Good  Fund of Knowledge: Good  Language: Good  Akathisia:  No  Handed:  Right  AIMS (if indicated): not done  Assets:  Communication Skills Desire for Improvement Physical Health Resilience Social Support Talents/Skills  ADL's:  Intact  Cognition: WNL  Sleep:  Good   Screenings:   Assessment and Plan: This patient is a 52 year old female with a history of depression.  She seems to be doing well on Celexa 20 mg daily.  She seems quite motivated to return to work.  She will continue this dosage and return to see me in 3 months   Diannia Ruder, MD 08/08/2017, 10:42 AM

## 2017-09-04 ENCOUNTER — Other Ambulatory Visit: Payer: Self-pay

## 2017-09-04 ENCOUNTER — Emergency Department (HOSPITAL_COMMUNITY)
Admission: EM | Admit: 2017-09-04 | Discharge: 2017-09-04 | Disposition: A | Discharge status: Home / Self Care | Payer: Self-pay | Attending: Emergency Medicine | Admitting: Emergency Medicine

## 2017-09-04 ENCOUNTER — Encounter (HOSPITAL_COMMUNITY): Payer: Self-pay | Admitting: Emergency Medicine

## 2017-09-04 DIAGNOSIS — F17210 Nicotine dependence, cigarettes, uncomplicated: Secondary | ICD-10-CM | POA: Insufficient documentation

## 2017-09-04 DIAGNOSIS — I10 Essential (primary) hypertension: Secondary | ICD-10-CM | POA: Insufficient documentation

## 2017-09-04 DIAGNOSIS — Z79899 Other long term (current) drug therapy: Secondary | ICD-10-CM | POA: Insufficient documentation

## 2017-09-04 DIAGNOSIS — J329 Chronic sinusitis, unspecified: Secondary | ICD-10-CM | POA: Insufficient documentation

## 2017-09-04 DIAGNOSIS — J069 Acute upper respiratory infection, unspecified: Secondary | ICD-10-CM | POA: Insufficient documentation

## 2017-09-04 MED ORDER — PREDNISONE 20 MG PO TABS
40.0000 mg | ORAL_TABLET | Freq: Once | ORAL | Status: AC
  Administered 2017-09-04: 40 mg via ORAL
  Filled 2017-09-04: qty 2

## 2017-09-04 MED ORDER — HYDROCODONE-HOMATROPINE 5-1.5 MG/5ML PO SYRP: ORAL_SOLUTION | ORAL | 0 refills | Status: DC

## 2017-09-04 MED ORDER — DEXAMETHASONE 4 MG PO TABS: 4.0000 mg | ORAL_TABLET | Freq: Two times a day (BID) | ORAL | 0 refills | Status: DC

## 2017-09-04 MED ORDER — ACETAMINOPHEN 500 MG PO TABS
1000.0000 mg | ORAL_TABLET | Freq: Once | ORAL | Status: AC
  Administered 2017-09-04: 1000 mg via ORAL
  Filled 2017-09-04: qty 2

## 2017-09-04 MED ORDER — ONDANSETRON HCL 4 MG PO TABS
4.0000 mg | ORAL_TABLET | Freq: Once | ORAL | Status: AC
  Administered 2017-09-04: 4 mg via ORAL
  Filled 2017-09-04: qty 1

## 2017-09-04 MED ORDER — LORATADINE-PSEUDOEPHEDRINE ER 5-120 MG PO TB12: 1.0000 | ORAL_TABLET | Freq: Two times a day (BID) | ORAL | 0 refills | Status: DC

## 2017-09-04 MED ORDER — PSEUDOEPHEDRINE HCL 60 MG PO TABS
60.0000 mg | ORAL_TABLET | Freq: Once | ORAL | Status: AC
  Administered 2017-09-04: 60 mg via ORAL
  Filled 2017-09-04: qty 1

## 2017-09-04 NOTE — Discharge Instructions (Addendum)
Your examination suggest sinusitis and upper respiratory infection.  Please increase fluids.  Please wash hands frequently.  Usual mask until symptoms have resolved.  Use Decadron and Claritin-D 2 times daily with food.  Use Tylenol every 4 hours, or ibuprofen every 6 hours for fever, or aching.  Use Hycodan for cough at bedtime.  Hycodan may cause drowsiness. This medication may cause drowsiness. Please do not drink, drive, or participate in activity that requires concentration while taking this medication.

## 2017-09-04 NOTE — ED Triage Notes (Signed)
Pt states sore throat, nasal congestion and cough over two weeks. Denies fever.

## 2017-09-04 NOTE — ED Provider Notes (Signed)
[facility] EMERGENCY DEPARTMENT Provider Note   CSN: [id number] Arrival date & time: 09/04/17  1256     History   Chief Complaint Chief Complaint  Patient presents with  . Sore Throat    HPI Joanne Frazier is a 52 y.o. female.  The history is provided by the patient.  URI   This is a new problem. The current episode started more than 1 week ago. The problem has been gradually worsening. There has been no fever. Associated symptoms include nausea, congestion, rhinorrhea, sore throat and cough. Pertinent negatives include no chest pain, no abdominal pain, no dysuria, no neck pain and no wheezing. Treatments tried: sinus headache medication from [facility]. The treatment provided mild relief.    Past Medical History:  Diagnosis Date  . Depression   . Headache(784.0)   . Hearing impairment   . History of anemia   . Hypertension   . Irritable bowel syndrome   . Missed period 08/19/2013  . Obesity   . Thyroid disease     Patient Active Problem List   Diagnosis Date Noted  . Missed period 08/19/2013  . Decreased libido 08/18/2012  . Depression 08/21/2011    History reviewed. No pertinent surgical history.   OB History    Gravida  3   Para  2   Term      Preterm      AB  1   Living  2     SAB  1   TAB      Ectopic      Multiple      Live Births  2            Home Medications    Prior to Admission medications   Medication Sig Start Date End Date Taking? Authorizing Provider  citalopram (CELEXA) 20 MG tablet Take 1 tablet (20 mg total) by mouth daily. 08/08/17   Myrlene Broker, MD    Family History Family History  Problem Relation Age of Onset  . Depression Sister   . Depression Brother   . Depression Brother   . Alcohol abuse Paternal Uncle   . Cirrhosis Paternal Uncle   . Depression Maternal Grandmother        "Nervous Breakdown"  . Seizures Father   . ADD / ADHD Daughter   . ADD / ADHD Son   . Cancer Maternal Grandfather    colon  . Drug abuse Neg Hx   . Dementia Neg Hx   . Bipolar disorder Neg Hx   . Anxiety disorder Neg Hx   . OCD Neg Hx   . Paranoid behavior Neg Hx   . Schizophrenia Neg Hx   . Sexual abuse Neg Hx   . Physical abuse Neg Hx     Social History Social History   Tobacco Use  . Smoking status: Current Every Day Smoker    Packs/day: 0.50    Years: 9.00    Pack years: 4.50    Types: Cigarettes  . Smokeless tobacco: Never Used  Substance Use Topics  . Alcohol use: No  . Drug use: No     Allergies   Sulfa antibiotics   Review of Systems Review of Systems  Constitutional: Positive for activity change and appetite change. Negative for fever.       All ROS Neg except as noted in HPI  HENT: Positive for congestion, rhinorrhea and sore throat. Negative for nosebleeds.   Eyes: Negative for photophobia and discharge.  Respiratory:  Positive for cough. Negative for shortness of breath and wheezing.   Cardiovascular: Negative for chest pain and palpitations.  Gastrointestinal: Positive for nausea. Negative for abdominal pain and blood in stool.  Genitourinary: Negative for dysuria, frequency and hematuria.  Musculoskeletal: Negative for arthralgias, back pain and neck pain.  Skin: Negative.   Neurological: Negative for dizziness, seizures and speech difficulty.  Psychiatric/Behavioral: Negative for confusion and hallucinations.     Physical Exam Updated Vital Signs BP (!) 153/93 (BP Location: Right Arm)   Pulse (!) 103   Temp 98 F (36.7 C) (Oral)   Resp 20   Ht 5\' 6"  (1.676 m)   Wt 107 kg (236 lb)   SpO2 100%   BMI 38.09 kg/m   Physical Exam  Constitutional: She is oriented to person, place, and time. She appears well-developed and well-nourished.  Non-toxic appearance.  HENT:  Head: Normocephalic.  Right Ear: Tympanic membrane and external ear normal.  Left Ear: Tympanic membrane and external ear normal.  Nasal congestion present. Mild increase redness of the  posterior pharynx Airway patent.  Eyes: Pupils are equal, round, and reactive to light. EOM and lids are normal.  Neck: Normal range of motion. Neck supple. Carotid bruit is not present.  Cardiovascular: Regular rhythm, normal heart sounds, intact distal pulses and normal pulses. Tachycardia present.  Pulmonary/Chest: Breath sounds normal. No respiratory distress.  Abdominal: Soft. Bowel sounds are normal. There is no tenderness. There is no guarding.  Musculoskeletal: Normal range of motion.  Lymphadenopathy:       Head (right side): No submandibular adenopathy present.       Head (left side): No submandibular adenopathy present.    She has no cervical adenopathy.  Neurological: She is alert and oriented to person, place, and time. She has normal strength. No cranial nerve deficit or sensory deficit.  Skin: Skin is warm and dry.  Psychiatric: She has a normal mood and affect. Her speech is normal.  Nursing note and vitals reviewed.    ED Treatments / Results  Labs (all labs ordered are listed, but only abnormal results are displayed) Labs Reviewed - No data to display  EKG None  Radiology No results found.  Procedures Procedures (including critical care time)  Medications Ordered in ED Medications - No data to display   Initial Impression / Assessment and Plan / ED Course  I have reviewed the triage vital signs and the nursing notes.  Pertinent labs & imaging results that were available during my care of the patient were reviewed by me and considered in my medical decision making (see chart for details).      Final Clinical Impressions(s) / ED Diagnoses  MDM Vital signs reviewed.  Pulse oximetry is 97% on room air.  Within normal limits by my interpretation.  The examination favors sinusitis and upper respiratory infection.  No acute changes appreciated at this time. Prescription for Claritin-D, Decadron, and Vicodin given to the patient.  We discussed the importance  of good handwashing.  We also discussed the importance of good hydration.  The patient is to follow-up with her primary physician or return to the emergency department if not improving.   Final diagnoses:  Upper respiratory tract infection, unspecified type  Other sinusitis, unspecified chronicity    ED Discharge Orders        Ordered    loratadine-pseudoephedrine (CLARITIN-D 12 HOUR) 5-120 MG tablet  2 times daily     09/04/17 1351    dexamethasone (DECADRON) 4 MG  tablet  2 times daily with meals     09/04/17 1353    HYDROcodone-homatropine (HYCODAN) 5-1.5 MG/5ML syrup     09/04/17 1353       Ivery Quale, PA-C 09/04/17 1728    Doug Sou, MD 09/05/17 [phone]

## 2017-09-08 ENCOUNTER — Other Ambulatory Visit: Payer: Self-pay

## 2017-09-08 ENCOUNTER — Encounter (HOSPITAL_COMMUNITY): Payer: Self-pay | Admitting: Emergency Medicine

## 2017-09-08 ENCOUNTER — Emergency Department (HOSPITAL_COMMUNITY)
Admission: EM | Admit: 2017-09-08 | Discharge: 2017-09-08 | Disposition: A | Discharge status: Home / Self Care | Payer: Self-pay | Attending: Emergency Medicine | Admitting: Emergency Medicine

## 2017-09-08 DIAGNOSIS — H109 Unspecified conjunctivitis: Secondary | ICD-10-CM

## 2017-09-08 DIAGNOSIS — I10 Essential (primary) hypertension: Secondary | ICD-10-CM | POA: Insufficient documentation

## 2017-09-08 DIAGNOSIS — H1032 Unspecified acute conjunctivitis, left eye: Secondary | ICD-10-CM | POA: Insufficient documentation

## 2017-09-08 DIAGNOSIS — E039 Hypothyroidism, unspecified: Secondary | ICD-10-CM | POA: Insufficient documentation

## 2017-09-08 DIAGNOSIS — F17210 Nicotine dependence, cigarettes, uncomplicated: Secondary | ICD-10-CM | POA: Insufficient documentation

## 2017-09-08 MED ORDER — TOBRAMYCIN 0.3 % OP SOLN
1.0000 [drp] | Freq: Once | OPHTHALMIC | Status: AC
  Administered 2017-09-08: 1 [drp] via OPHTHALMIC
  Filled 2017-09-08: qty 5

## 2017-09-08 MED ORDER — TETRACAINE HCL 0.5 % OP SOLN
2.0000 [drp] | Freq: Once | OPHTHALMIC | Status: AC
  Administered 2017-09-08: 2 [drp] via OPHTHALMIC
  Filled 2017-09-08: qty 4

## 2017-09-08 MED ORDER — FLUORESCEIN SODIUM 1 MG OP STRP
1.0000 | ORAL_STRIP | Freq: Once | OPHTHALMIC | Status: DC
  Filled 2017-09-08: qty 1

## 2017-09-08 NOTE — Discharge Instructions (Addendum)
Frequent warm wet compresses on and off to your eye.  Apply 1 drop of an over-the-counter lubricating eyedrop to the left eye every 2 hours.  Apply the tobramycin 1 drop every 4 hours for 5-7 days.  You can contact the ophthalmologist listed to arrange a follow-up appointment if needed.  Return to the ER for any worsening symptoms.

## 2017-09-08 NOTE — ED Triage Notes (Addendum)
Patient c/o redness and irritation to left eye. Patient reports itching and clear drainage. Per patient started yesterday after wearing contacts. Denies any blurred vision.

## 2017-09-10 NOTE — ED Provider Notes (Signed)
[facility] EMERGENCY DEPARTMENT Provider Note   CSN: [id number] Arrival date & time: 09/08/17  1334     History   Chief Complaint Chief Complaint  Patient presents with  . Eye Problem    HPI Joanne Frazier is a 52 y.o. female.  HPI   Joanne Frazier is a 52 y.o. female who presents to the Emergency Department complaining of redness and irritation to her left eye.  Symptoms began 1 day prior to arrival.  She describes itching and excessive tearing of the eye.  She was seen here several days ago and treated for viral symptoms   Which continue to be present.  She denies headache, facial pain or swelling, fever, chills, dizziness or blurred vision.  She states her symptoms began after wearing her contacts.  After the symptoms began, she states she has not worn her contacts since then.  Past Medical History:  Diagnosis Date  . Depression   . Headache(784.0)   . Hearing impairment   . History of anemia   . Hypertension   . Irritable bowel syndrome   . Missed period 08/19/2013  . Obesity   . Thyroid disease     Patient Active Problem List   Diagnosis Date Noted  . Missed period 08/19/2013  . Decreased libido 08/18/2012  . Depression 08/21/2011    History reviewed. No pertinent surgical history.   OB History    Gravida  3   Para  2   Term      Preterm      AB  1   Living  2     SAB  1   TAB      Ectopic      Multiple      Live Births  2            Home Medications    Prior to Admission medications   Medication Sig Start Date End Date Taking? Authorizing Provider  citalopram (CELEXA) 20 MG tablet Take 1 tablet (20 mg total) by mouth daily. 08/08/17   Myrlene Broker, MD  dexamethasone (DECADRON) 4 MG tablet Take 1 tablet (4 mg total) by mouth 2 (two) times daily with a meal. 09/04/17   Ivery Quale, PA-C  HYDROcodone-homatropine [facility]) 5-1.5 MG/5ML syrup 5ml at hs for cough and congestion or q6h prn. 09/04/17   Ivery Quale, PA-C    loratadine-pseudoephedrine (CLARITIN-D 12 HOUR) 5-120 MG tablet Take 1 tablet by mouth 2 (two) times daily. 09/04/17   Ivery Quale, PA-C    Family History Family History  Problem Relation Age of Onset  . Depression Sister   . Depression Brother   . Depression Brother   . Alcohol abuse Paternal Uncle   . Cirrhosis Paternal Uncle   . Depression Maternal Grandmother        "Nervous Breakdown"  . Seizures Father   . ADD / ADHD Daughter   . ADD / ADHD Son   . Cancer Maternal Grandfather        colon  . Drug abuse Neg Hx   . Dementia Neg Hx   . Bipolar disorder Neg Hx   . Anxiety disorder Neg Hx   . OCD Neg Hx   . Paranoid behavior Neg Hx   . Schizophrenia Neg Hx   . Sexual abuse Neg Hx   . Physical abuse Neg Hx     Social History Social History   Tobacco Use  . Smoking status: Current Every Day Smoker  Packs/day: 0.50    Years: 9.00    Pack years: 4.50    Types: Cigarettes  . Smokeless tobacco: Never Used  Substance Use Topics  . Alcohol use: No  . Drug use: No     Allergies   Bupropion; Losartan; Metoprolol; Naproxen sodium; Sulfa antibiotics; and Prednisone   Review of Systems Review of Systems  Constitutional: Negative for activity change, appetite change, chills and fever.  HENT: Positive for congestion and rhinorrhea. Negative for ear pain, facial swelling, sore throat and trouble swallowing.   Eyes: Positive for discharge and itching. Negative for photophobia and visual disturbance.  Respiratory: Negative for cough, shortness of breath, wheezing and stridor.   Gastrointestinal: Negative for nausea and vomiting.  Musculoskeletal: Negative for neck pain and neck stiffness.  Skin: Negative for rash.  Neurological: Negative for dizziness, speech difficulty, weakness, numbness and headaches.  Hematological: Negative for adenopathy.  Psychiatric/Behavioral: Negative for confusion.  All other systems reviewed and are negative.    Physical Exam Updated  Vital Signs BP (!) 156/75 (BP Location: Right Arm)   Pulse 95   Temp 98 F (36.7 C) (Oral)   Resp 12   Ht 5\' 6"  (1.676 m)   Wt 107 kg (236 lb)   SpO2 98%   BMI 38.09 kg/m   Physical Exam  Constitutional: She appears well-developed and well-nourished. No distress.  HENT:  Head: Normocephalic and atraumatic.  Mouth/Throat: Oropharynx is clear and moist.  Eyes: Pupils are equal, round, and reactive to light. EOM are normal. Lids are everted and swept, no foreign bodies found. Right eye exhibits no chemosis. Left eye exhibits no chemosis, no exudate and no hordeolum. No foreign body present in the left eye. Right conjunctiva is not injected. Left conjunctiva is injected.  Fundoscopic exam:      The left eye shows no hemorrhage and no papilledema.  Slit lamp exam:      The left eye shows no corneal abrasion, no corneal flare, no corneal ulcer, no hyphema and no fluorescein uptake.  Slit lamp exam reveals negative Seidel's sign, no hyphema,FB, or corneal ulcer  Neck: Normal range of motion. Neck supple. No thyromegaly present.  Cardiovascular: Normal rate, regular rhythm and intact distal pulses.  No murmur heard. Pulmonary/Chest: Effort normal and breath sounds normal. No respiratory distress.  Musculoskeletal: Normal range of motion.  Lymphadenopathy:    She has no cervical adenopathy.  Neurological: She is alert. No sensory deficit. She exhibits normal muscle tone. Coordination normal.  CN II-XII intact  Skin: Skin is warm and dry. No rash noted.  Psychiatric: She has a normal mood and affect.  Nursing note and vitals reviewed.    ED Treatments / Results  Labs (all labs ordered are listed, but only abnormal results are displayed) Labs Reviewed - No data to display  EKG None  Radiology No results found.  Procedures Procedures (including critical care time)  Medications Ordered in ED Medications  tetracaine (PONTOCAINE) 0.5 % ophthalmic solution 2 drop (2 drops Right  Eye Given 09/08/17 1526)  tobramycin (TOBREX) 0.3 % ophthalmic solution 1 drop (1 drop Left Eye Given 09/08/17 1526)     Initial Impression / Assessment and Plan / ED Course  I have reviewed the triage vital signs and the nursing notes.  Pertinent labs & imaging results that were available during my care of the patient were reviewed by me and considered in my medical decision making (see chart for details).       Visual Acuity  Right Eye Distance: 20/25 Left Eye Distance: 20/30 Bilateral Distance:     Patient well-appearing.  No visual changes or corneal ulcer seen on exam.  Patient agrees to treatment plan with warm compresses of her eye, tobramycin dispensed she agrees to also use over-the-counter lubricating eyedrops.  Ophthalmology follow-up if needed.  Return precautions discussed.  Final Clinical Impressions(s) / ED Diagnoses   Final diagnoses:  Conjunctivitis of left eye, unspecified conjunctivitis type    ED Discharge Orders    None       Pauline Ausriplett, Markiah Janeway, PA-C 09/10/17 1317    Samuel JesterMcManus, Kathleen, DO 09/10/17 1338

## 2017-11-06 ENCOUNTER — Ambulatory Visit (HOSPITAL_COMMUNITY): Payer: Self-pay | Admitting: Psychiatry

## 2017-12-15 ENCOUNTER — Emergency Department (HOSPITAL_COMMUNITY)
Admission: EM | Admit: 2017-12-15 | Discharge: 2017-12-15 | Disposition: A | Discharge status: Home / Self Care | Payer: Self-pay | Attending: Emergency Medicine | Admitting: Emergency Medicine

## 2017-12-15 ENCOUNTER — Encounter (HOSPITAL_COMMUNITY): Payer: Self-pay | Admitting: Emergency Medicine

## 2017-12-15 ENCOUNTER — Other Ambulatory Visit: Payer: Self-pay

## 2017-12-15 DIAGNOSIS — R600 Localized edema: Secondary | ICD-10-CM | POA: Insufficient documentation

## 2017-12-15 DIAGNOSIS — R21 Rash and other nonspecific skin eruption: Secondary | ICD-10-CM | POA: Insufficient documentation

## 2017-12-15 DIAGNOSIS — Z5321 Procedure and treatment not carried out due to patient leaving prior to being seen by health care provider: Secondary | ICD-10-CM | POA: Insufficient documentation

## 2017-12-15 NOTE — ED Triage Notes (Signed)
Pt is depressed, not having thought of harming herself today, she has had them in the past.  Family lives locally, but she can not stay with them.

## 2017-12-15 NOTE — ED Triage Notes (Signed)
Pt states she is homeless and the present time, sleeping sitting up in her car, notice bilateral edema in legs, red rash also noted.

## 2017-12-16 ENCOUNTER — Encounter (HOSPITAL_COMMUNITY): Payer: Self-pay | Admitting: Emergency Medicine

## 2017-12-16 ENCOUNTER — Other Ambulatory Visit: Payer: Self-pay

## 2017-12-16 ENCOUNTER — Emergency Department (HOSPITAL_COMMUNITY)
Admission: EM | Admit: 2017-12-16 | Discharge: 2017-12-16 | Disposition: A | Discharge status: Home / Self Care | Payer: Self-pay | Attending: Emergency Medicine | Admitting: Emergency Medicine

## 2017-12-16 DIAGNOSIS — Z590 Homelessness: Secondary | ICD-10-CM | POA: Insufficient documentation

## 2017-12-16 DIAGNOSIS — I10 Essential (primary) hypertension: Secondary | ICD-10-CM | POA: Insufficient documentation

## 2017-12-16 DIAGNOSIS — R2243 Localized swelling, mass and lump, lower limb, bilateral: Secondary | ICD-10-CM | POA: Insufficient documentation

## 2017-12-16 DIAGNOSIS — R600 Localized edema: Secondary | ICD-10-CM

## 2017-12-16 DIAGNOSIS — F17210 Nicotine dependence, cigarettes, uncomplicated: Secondary | ICD-10-CM | POA: Insufficient documentation

## 2017-12-16 LAB — CBC WITH DIFFERENTIAL/PLATELET
Basophils Absolute: 0.1 10*3/uL (ref 0.0–0.1)
Basophils Relative: 1 %
EOS ABS: 0.1 10*3/uL (ref 0.0–0.7)
Eosinophils Relative: 1 %
HEMATOCRIT: 36.9 % (ref 36.0–46.0)
HEMOGLOBIN: 12.1 g/dL (ref 12.0–15.0)
LYMPHS ABS: 3.4 10*3/uL (ref 0.7–4.0)
Lymphocytes Relative: 36 %
MCH: 31.1 pg (ref 26.0–34.0)
MCHC: 32.8 g/dL (ref 30.0–36.0)
MCV: 94.9 fL (ref 78.0–100.0)
MONOS PCT: 4 %
Monocytes Absolute: 0.4 10*3/uL (ref 0.1–1.0)
NEUTROS ABS: 5.5 10*3/uL (ref 1.7–7.7)
NEUTROS PCT: 58 %
Platelets: 192 10*3/uL (ref 150–400)
RBC: 3.89 MIL/uL (ref 3.87–5.11)
RDW: 12.9 % (ref 11.5–15.5)
WBC: 9.5 10*3/uL (ref 4.0–10.5)

## 2017-12-16 LAB — COMPREHENSIVE METABOLIC PANEL
ALK PHOS: 101 U/L (ref 38–126)
ALT: 17 U/L (ref 0–44)
ANION GAP: 7 (ref 5–15)
AST: 16 U/L (ref 15–41)
Albumin: 3.8 g/dL (ref 3.5–5.0)
BILIRUBIN TOTAL: 0.7 mg/dL (ref 0.3–1.2)
BUN: 15 mg/dL (ref 6–20)
CALCIUM: 8.5 mg/dL — AB (ref 8.9–10.3)
CO2: 28 mmol/L (ref 22–32)
Chloride: 105 mmol/L (ref 98–111)
Creatinine, Ser: 0.76 mg/dL (ref 0.44–1.00)
GLUCOSE: 127 mg/dL — AB (ref 70–99)
POTASSIUM: 3.6 mmol/L (ref 3.5–5.1)
Sodium: 140 mmol/L (ref 135–145)
TOTAL PROTEIN: 6.9 g/dL (ref 6.5–8.1)

## 2017-12-16 NOTE — ED Provider Notes (Signed)
[facility] EMERGENCY DEPARTMENT Provider Note   CSN: [id number] Arrival date & time: 12/16/17  1612     History   Chief Complaint Chief Complaint  Patient presents with  . Leg Swelling    HPI Joanne Frazier is a 52 y.o. female.  HPI Patient presents with concern of bilateral lower extremity swelling. Swelling is bilateral, symmetric, has been present for about 4 days. It began soon after the patient began sleeping in her car due to homelessness.  No she does have some erythema in the left medial shin, but no other complaints including dyspnea, chest pain, lightheadedness, syncope, distal loss of sensation or weakness. There is some numbness in her legs bilaterally.  Past Medical History:  Diagnosis Date  . Depression   . Headache(784.0)   . Hearing impairment   . History of anemia   . Hypertension   . Irritable bowel syndrome   . Missed period 08/19/2013  . Obesity   . Thyroid disease     Patient Active Problem List   Diagnosis Date Noted  . Missed period 08/19/2013  . Decreased libido 08/18/2012  . Depression 08/21/2011    History reviewed. No pertinent surgical history.   OB History    Gravida  3   Para  2   Term      Preterm      AB  1   Living  2     SAB  1   TAB      Ectopic      Multiple      Live Births  2            Home Medications    Prior to Admission medications   Medication Sig Start Date End Date Taking? Authorizing Provider  citalopram (CELEXA) 20 MG tablet Take 1 tablet (20 mg total) by mouth daily. Patient taking differently: Take 20 mg by mouth every evening.  08/08/17   Myrlene Broker, MD  phenylephrine (SUDAFED PE) 10 MG TABS tablet Take 10 mg by mouth every 4 (four) hours as needed (decongestion).    [provider]    Family History Family History  Problem Relation Age of Onset  . Depression Sister   . Depression Brother   . Depression Brother   . Alcohol abuse Paternal Uncle   . Cirrhosis  Paternal Uncle   . Depression Maternal Grandmother        "Nervous Breakdown"  . Seizures Father   . ADD / ADHD Daughter   . ADD / ADHD Son   . Cancer Maternal Grandfather        colon  . Drug abuse Neg Hx   . Dementia Neg Hx   . Bipolar disorder Neg Hx   . Anxiety disorder Neg Hx   . OCD Neg Hx   . Paranoid behavior Neg Hx   . Schizophrenia Neg Hx   . Sexual abuse Neg Hx   . Physical abuse Neg Hx     Social History Social History   Tobacco Use  . Smoking status: Current Every Day Smoker    Packs/day: 0.50    Years: 9.00    Pack years: 4.50    Types: Cigarettes  . Smokeless tobacco: Never Used  Substance Use Topics  . Alcohol use: No  . Drug use: No     Allergies   Bupropion; Losartan; Metoprolol; Naproxen sodium; Sulfa antibiotics; and Prednisone   Review of Systems Review of Systems  Constitutional:  Per HPI, otherwise negative  HENT:       Per HPI, otherwise negative  Respiratory:       Per HPI, otherwise negative  Cardiovascular:       Per HPI, otherwise negative  Gastrointestinal: Negative for vomiting.  Endocrine:       Negative aside from HPI  Genitourinary:       Neg aside from HPI   Musculoskeletal:       Per HPI, otherwise negative  Skin: Negative.   Neurological: Negative for syncope.     Physical Exam Updated Vital Signs BP (!) 152/78   Pulse 90   Temp 98.2 F (36.8 C) (Oral)   Resp 18   SpO2 96%   Physical Exam  Constitutional: She is oriented to person, place, and time. She appears well-developed and well-nourished. No distress.  HENT:  Head: Normocephalic and atraumatic.  Eyes: Conjunctivae and EOM are normal.  Cardiovascular: Normal rate and regular rhythm.  Pulmonary/Chest: Effort normal and breath sounds normal. No stridor. No respiratory distress.  Abdominal: She exhibits no distension.  Musculoskeletal: She exhibits edema.  Neurological: She is alert and oriented to person, place, and time. No cranial nerve  deficit.  Skin: Skin is warm and dry.  Minimal erythema around the right mid distal calf, with no confluence, no induration, no raised lesions  Psychiatric: She has a normal mood and affect.  Nursing note and vitals reviewed.    ED Treatments / Results  Labs (all labs ordered are listed, but only abnormal results are displayed) Labs Reviewed  COMPREHENSIVE METABOLIC PANEL - Abnormal; Notable for the following components:      Result Value   Glucose, Bld 127 (*)    Calcium 8.5 (*)    All other components within normal limits  CBC WITH DIFFERENTIAL/PLATELET    Procedures Procedures (including critical care time)  Medications Ordered in ED Medications - No data to display   Initial Impression / Assessment and Plan / ED Course  I have reviewed the triage vital signs and the nursing notes.  Pertinent labs & imaging results that were available during my care of the patient were reviewed by me and considered in my medical decision making (see chart for details).  She presents of bilateral calf swelling, subjective paresthesia. She is awake, alert, ambulatory, in no distress, with unremarkable vital signs. Patient's onset of illness after she began to be in a car suggests gravity dependent lesions. No risk factor for DVT, and given the symmetric nature, this is unlikely. Patient has no respiratory complaints, low suspicion for PE as well. I discussed the patient's case with our nursing staff, to facilitate resources for shelter, other services for the patient, and she was discharged in stable condition.  Final Clinical Impressions(s) / ED Diagnoses  Bilateral lower extremity edema, initial encounter   Gerhard MunchLockwood, Yeison Sippel, MD 12/16/17 Paulo [id number]

## 2017-12-16 NOTE — Discharge Instructions (Addendum)
As discussed, your evaluation today has been largely reassuring.  But, it is important that you monitor your condition carefully, and do not hesitate to return to the ED if you develop new, or concerning changes in your condition. ? ?Otherwise, please follow-up with your physician for appropriate ongoing care. ? ?

## 2017-12-16 NOTE — ED Triage Notes (Signed)
PT states she was in the ED on 12/15/17 but had to leave to go to work before she could get seen. PT states continued bilateral lower leg swelling and red rash at times.

## 2017-12-16 NOTE — ED Notes (Signed)
Pt ambulatory to waiting room. Pt verbalized understanding of discharge instructions.   

## 2017-12-16 NOTE — ED Notes (Signed)
Follow up call made  No answer  12/16/17  0825  s Ross Bender rn

## 2018-05-13 ENCOUNTER — Emergency Department (HOSPITAL_COMMUNITY)
Admission: EM | Admit: 2018-05-13 | Discharge: 2018-05-13 | Disposition: A | Discharge status: Home / Self Care | Payer: BLUE CROSS/BLUE SHIELD | Attending: Emergency Medicine | Admitting: Emergency Medicine

## 2018-05-13 ENCOUNTER — Encounter (HOSPITAL_COMMUNITY): Payer: Self-pay | Admitting: Emergency Medicine

## 2018-05-13 ENCOUNTER — Other Ambulatory Visit: Payer: Self-pay

## 2018-05-13 ENCOUNTER — Emergency Department (HOSPITAL_COMMUNITY): Payer: BLUE CROSS/BLUE SHIELD

## 2018-05-13 DIAGNOSIS — I10 Essential (primary) hypertension: Secondary | ICD-10-CM | POA: Insufficient documentation

## 2018-05-13 DIAGNOSIS — R05 Cough: Secondary | ICD-10-CM | POA: Diagnosis present

## 2018-05-13 DIAGNOSIS — F17210 Nicotine dependence, cigarettes, uncomplicated: Secondary | ICD-10-CM | POA: Diagnosis not present

## 2018-05-13 DIAGNOSIS — J40 Bronchitis, not specified as acute or chronic: Secondary | ICD-10-CM | POA: Insufficient documentation

## 2018-05-13 DIAGNOSIS — Z79899 Other long term (current) drug therapy: Secondary | ICD-10-CM | POA: Insufficient documentation

## 2018-05-13 MED ORDER — ALBUTEROL SULFATE HFA 108 (90 BASE) MCG/ACT IN AERS
2.0000 | INHALATION_SPRAY | Freq: Once | RESPIRATORY_TRACT | Status: AC
  Administered 2018-05-13: 2 via RESPIRATORY_TRACT
  Filled 2018-05-13: qty 6.7

## 2018-05-13 MED ORDER — BENZONATATE 100 MG PO CAPS
200.0000 mg | ORAL_CAPSULE | Freq: Once | ORAL | Status: AC
  Administered 2018-05-13: 200 mg via ORAL
  Filled 2018-05-13: qty 2

## 2018-05-13 MED ORDER — BENZONATATE 200 MG PO CAPS: 200.0000 mg | ORAL_CAPSULE | Freq: Three times a day (TID) | ORAL | 0 refills | Status: DC | PRN

## 2018-05-13 MED ORDER — DEXAMETHASONE SODIUM PHOSPHATE 4 MG/ML IJ SOLN
10.0000 mg | Freq: Once | INTRAMUSCULAR | Status: AC
  Administered 2018-05-13: 10 mg via INTRAMUSCULAR
  Filled 2018-05-13: qty 3

## 2018-05-13 NOTE — Progress Notes (Signed)
**Note De-Identified Joanne Frazier Obfuscation** Patient educated on MDI medication and spacer.  Flow measured for adequate medication delivery with dial check

## 2018-05-13 NOTE — ED Notes (Signed)
Using OTC medication since Sunday without relief.

## 2018-05-13 NOTE — Discharge Instructions (Addendum)
You may take Tylenol every 4 hours if needed for pain or fever.  1 to 2 puffs of the inhaler every 4-6 hours as needed.  Follow-up with your primary provider or return to the ER if your symptoms worsen

## 2018-05-13 NOTE — ED Notes (Signed)
Patient given discharge instruction, verbalized understand. Patient ambulatory out of the department.  

## 2018-05-13 NOTE — ED Triage Notes (Signed)
PT c/o congested non-productive cough, nasal congestion unrelieved by OTC sinus medication x2 days with no fever.

## 2018-05-13 NOTE — ED Notes (Signed)
RT at the bedside.

## 2018-05-13 NOTE — ED Provider Notes (Signed)
[facility] EMERGENCY DEPARTMENT Provider Note   CSN: [id number] Arrival date & time: 05/13/18  1440     History   Chief Complaint Chief Complaint  Patient presents with  . Cough    HPI Joanne Frazier is a 52 y.o. female.  HPI   Joanne Frazier is a 52 y.o. female who presents to the Emergency Department complaining of nasal congestion and nonproductive cough.  Symptoms have been present for 2 days.  She describes a dry cough that is persistent and accompanied by nasal congestion and generalized body aches.  No fever.  She also denies chest pain or shortness of breath.  She does admit to working in [employer] and recent sick contacts.  She is tried over-the-counter cold and sinus medications with minimal to no relief.  No dizziness or exertional dyspnea.  Past Medical History:  Diagnosis Date  . Depression   . Headache(784.0)   . Hearing impairment   . History of anemia   . Hypertension   . Irritable bowel syndrome   . Missed period 08/19/2013  . Obesity   . Thyroid disease     Patient Active Problem List   Diagnosis Date Noted  . Missed period 08/19/2013  . Decreased libido 08/18/2012  . Depression 08/21/2011    History reviewed. No pertinent surgical history.   OB History    Gravida  3   Para  2   Term      Preterm      AB  1   Living  2     SAB  1   TAB      Ectopic      Multiple      Live Births  2            Home Medications    Prior to Admission medications   Medication Sig Start Date End Date Taking? Authorizing Provider  citalopram (CELEXA) 20 MG tablet Take 1 tablet (20 mg total) by mouth daily. Patient taking differently: Take 20 mg by mouth every evening.  08/08/17   Myrlene Broker, MD  phenylephrine (SUDAFED PE) 10 MG TABS tablet Take 10 mg by mouth every 4 (four) hours as needed (decongestion).    [provider]    Family History Family History  Problem Relation Age of Onset  . Depression Sister   .  Depression Brother   . Depression Brother   . Alcohol abuse Paternal Uncle   . Cirrhosis Paternal Uncle   . Depression Maternal Grandmother        "Nervous Breakdown"  . Seizures Father   . ADD / ADHD Daughter   . ADD / ADHD Son   . Cancer Maternal Grandfather        colon  . Drug abuse Neg Hx   . Dementia Neg Hx   . Bipolar disorder Neg Hx   . Anxiety disorder Neg Hx   . OCD Neg Hx   . Paranoid behavior Neg Hx   . Schizophrenia Neg Hx   . Sexual abuse Neg Hx   . Physical abuse Neg Hx     Social History Social History   Tobacco Use  . Smoking status: Current Every Day Smoker    Packs/day: 0.50    Years: 9.00    Pack years: 4.50    Types: Cigarettes  . Smokeless tobacco: Never Used  Substance Use Topics  . Alcohol use: No  . Drug use: No     Allergies  Bupropion; Losartan; Metoprolol; Naproxen sodium; Sulfa antibiotics; and Prednisone   Review of Systems Review of Systems  Constitutional: Negative for appetite change, chills and fever.  HENT: Positive for congestion, rhinorrhea, sinus pressure and sore throat. Negative for trouble swallowing.   Respiratory: Positive for cough. Negative for chest tightness, shortness of breath and wheezing.   Cardiovascular: Negative for chest pain.  Gastrointestinal: Negative for abdominal pain, nausea and vomiting.  Genitourinary: Negative for dysuria.  Musculoskeletal: Positive for myalgias (Generalized body aches). Negative for arthralgias.  Skin: Negative for rash.  Neurological: Negative for dizziness, syncope, weakness and numbness.  Hematological: Negative for adenopathy.     Physical Exam Updated Vital Signs BP (!) 150/73 (BP Location: Right Arm)   Pulse 88   Temp 98 F (36.7 C) (Oral)   Resp 18   Ht 5\' 6"  (1.676 m)   Wt 95.3 kg   SpO2 98%   BMI 33.89 kg/m   Physical Exam  Constitutional: She appears well-developed and well-nourished. No distress.  HENT:  Right Ear: Tympanic membrane and ear canal  normal.  Left Ear: Tympanic membrane and ear canal normal.  Nose: Mucosal edema present.  Mouth/Throat: Uvula is midline and mucous membranes are normal. No oral lesions. No uvula swelling. Posterior oropharyngeal erythema present. No oropharyngeal exudate, posterior oropharyngeal edema or tonsillar abscesses.  Neck: Normal range of motion.  Cardiovascular: Normal rate, regular rhythm and intact distal pulses.  Pulmonary/Chest: Effort normal. No respiratory distress. She has wheezes. She has no rales.  Scattered rhonchi.  Few expiratory wheezes of the upper lung fields.    Musculoskeletal: Normal range of motion.  Lymphadenopathy:    She has no cervical adenopathy.  Neurological: She is alert. No sensory deficit.  Skin: Skin is warm. Capillary refill takes less than 2 seconds. No rash noted.  Psychiatric: She has a normal mood and affect.  Nursing note and vitals reviewed.    ED Treatments / Results  Labs (all labs ordered are listed, but only abnormal results are displayed) Labs Reviewed - No data to display  EKG None  Radiology Dg Chest 2 View  Result Date: 05/13/2018 CLINICAL DATA:  52 year old female with cough and congestion for 2 days. EXAM: CHEST - 2 VIEW COMPARISON:  Chest radiographs 03/05/2017. FINDINGS: Stable lung volumes. Stable mild cardiomegaly. Other mediastinal contours are within normal limits. Visualized tracheal air column is within normal limits. Basilar predominant increased interstitial markings in both lungs appears stable since 2018. No pneumothorax, pleural effusion, or acute pulmonary opacity. Dextroconvex thoracic scoliosis redemonstrated. No acute osseous abnormality identified. Negative visible bowel gas pattern. IMPRESSION: No acute cardiopulmonary abnormality. Electronically Signed   By: Odessa Fleming M.D.   On: 05/13/2018 16:43    Procedures Procedures (including critical care time)  Medications Ordered in ED Medications - No data to display   Initial  Impression / Assessment and Plan / ED Course  I have reviewed the triage vital signs and the nursing notes.  Pertinent labs & imaging results that were available during my care of the patient were reviewed by me and considered in my medical decision making (see chart for details).    Pt with likely bronchitis.  No CP, dyspnea, or tachycardia.  Vitals reassuring.    Albuterol MDI dispensed here. Pt notes an intolerance to oral prednisone, but states she can take IM steroids w/o complications.  Agrees to tx plan and out pt f/u.  Return precautions discussed   Final Clinical Impressions(s) / ED Diagnoses   Final  diagnoses:  Bronchitis    ED Discharge Orders    None       Pauline Ausriplett, Aubrynn Katona, PA-C 05/13/18 1729    Eber HongMiller, Brian, MD 05/14/18 [phone]

## 2018-05-26 ENCOUNTER — Telehealth: Payer: Self-pay | Admitting: Orthopaedic Surgery

## 2018-05-26 ENCOUNTER — Emergency Department (HOSPITAL_COMMUNITY): Payer: PRIVATE HEALTH INSURANCE

## 2018-05-26 ENCOUNTER — Emergency Department (HOSPITAL_COMMUNITY)
Admission: EM | Admit: 2018-05-26 | Discharge: 2018-05-26 | Disposition: A | Discharge status: Home / Self Care | Payer: PRIVATE HEALTH INSURANCE | Attending: Emergency Medicine | Admitting: Emergency Medicine

## 2018-05-26 ENCOUNTER — Other Ambulatory Visit: Payer: Self-pay

## 2018-05-26 ENCOUNTER — Encounter (HOSPITAL_COMMUNITY): Payer: Self-pay | Admitting: Emergency Medicine

## 2018-05-26 DIAGNOSIS — S92422A Displaced fracture of distal phalanx of left great toe, initial encounter for closed fracture: Secondary | ICD-10-CM | POA: Diagnosis not present

## 2018-05-26 DIAGNOSIS — W228XXA Striking against or struck by other objects, initial encounter: Secondary | ICD-10-CM | POA: Insufficient documentation

## 2018-05-26 DIAGNOSIS — Z79899 Other long term (current) drug therapy: Secondary | ICD-10-CM | POA: Diagnosis not present

## 2018-05-26 DIAGNOSIS — F17210 Nicotine dependence, cigarettes, uncomplicated: Secondary | ICD-10-CM | POA: Insufficient documentation

## 2018-05-26 DIAGNOSIS — Y990 Civilian activity done for income or pay: Secondary | ICD-10-CM | POA: Diagnosis not present

## 2018-05-26 DIAGNOSIS — Y939 Activity, unspecified: Secondary | ICD-10-CM | POA: Insufficient documentation

## 2018-05-26 DIAGNOSIS — Y929 Unspecified place or not applicable: Secondary | ICD-10-CM | POA: Diagnosis not present

## 2018-05-26 DIAGNOSIS — I10 Essential (primary) hypertension: Secondary | ICD-10-CM | POA: Insufficient documentation

## 2018-05-26 DIAGNOSIS — S97112A Crushing injury of left great toe, initial encounter: Secondary | ICD-10-CM | POA: Diagnosis present

## 2018-05-26 MED ORDER — HYDROCODONE-ACETAMINOPHEN 5-325 MG PO TABS: 2.0000 | ORAL_TABLET | ORAL | 0 refills | Status: DC | PRN

## 2018-05-26 NOTE — ED Triage Notes (Addendum)
Pt states she was at work and dropped some weights on her L. Great toe. Toenail on L. Great toe bluish in color.

## 2018-05-26 NOTE — Telephone Encounter (Signed)
Joanne Frazier called and stated she went to ER today and she has a fractured toe.  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  She said she works for [employer].  I told her that she would need to speak to her supervisor and or her human resources department regarding their protocol for WC injuries.  I told her that someone from the [employer] insurance company would have to call our office to verify that it is okay for her to be seen here.  She said she understood and we get right on this.

## 2018-05-26 NOTE — Discharge Instructions (Addendum)
Use Tylenol, Motrin and ice as needed for pain. Use walking boot as directed and follow-up with orthopedics. For severe pain take norco or vicodin however realize they have the potential for addiction and it can make you sleepy and has tylenol in it.  No operating machinery while taking. If you were given medicines take as directed.  If you are on coumadin or contraceptives realize their levels and effectiveness is altered by many different medicines.  If you have any reaction (rash, tongues swelling, other) to the medicines stop taking and see a physician.    If your blood pressure was elevated in the ER make sure you follow up for management with a primary doctor or return for chest pain, shortness of breath or stroke symptoms.  Please follow up as directed and return to the ER or see a physician for new or worsening symptoms.  Thank you. Vitals:   05/26/18 0631 05/26/18 0633  BP:  (!) 145/82  Pulse:  75  Temp:  98.1 F (36.7 C)  TempSrc:  Oral  SpO2:  100%  Weight: 93.4 kg   Height: 5\' 6"  (1.676 m)

## 2018-05-26 NOTE — Telephone Encounter (Signed)
I can not advise her, if she is not our patient.

## 2018-05-26 NOTE — ED Provider Notes (Signed)
[facility] EMERGENCY DEPARTMENT Provider Note   CSN: [id number] Arrival date & time: 05/26/18  4098     History   Chief Complaint Chief Complaint  Patient presents with  . Foot Pain    HPI OTILA STARN is a 52 y.o. female.  Patient presents with left great toe pain since prior to arrival.  Patient was at work and dropped 44 pound weight on that toe.  Patient swelling and pain with range of motion.  Patient's had a fracture in the past.     Past Medical History:  Diagnosis Date  . Depression   . Headache(784.0)   . Hearing impairment   . History of anemia   . Hypertension   . Irritable bowel syndrome   . Missed period 08/19/2013  . Obesity   . Thyroid disease     Patient Active Problem List   Diagnosis Date Noted  . Missed period 08/19/2013  . Decreased libido 08/18/2012  . Depression 08/21/2011    History reviewed. No pertinent surgical history.   OB History    Gravida  3   Para  2   Term      Preterm      AB  1   Living  2     SAB  1   TAB      Ectopic      Multiple      Live Births  2            Home Medications    Prior to Admission medications   Medication Sig Start Date End Date Taking? Authorizing Provider  benzonatate (TESSALON) 200 MG capsule Take 1 capsule (200 mg total) by mouth 3 (three) times daily as needed for cough. Swallow whole, do not chew 05/13/18  Yes Triplett, Tammy, PA-C  citalopram (CELEXA) 20 MG tablet Take 1 tablet (20 mg total) by mouth daily. Patient taking differently: Take 20 mg by mouth every evening.  08/08/17  Yes Myrlene Broker, MD  phenylephrine (SUDAFED PE) 10 MG TABS tablet Take 10 mg by mouth every 4 (four) hours as needed (decongestion).    [provider]    Family History Family History  Problem Relation Age of Onset  . Depression Sister   . Depression Brother   . Depression Brother   . Alcohol abuse Paternal Uncle   . Cirrhosis Paternal Uncle   . Depression Maternal  Grandmother        "Nervous Breakdown"  . Seizures Father   . ADD / ADHD Daughter   . ADD / ADHD Son   . Cancer Maternal Grandfather        colon  . Drug abuse Neg Hx   . Dementia Neg Hx   . Bipolar disorder Neg Hx   . Anxiety disorder Neg Hx   . OCD Neg Hx   . Paranoid behavior Neg Hx   . Schizophrenia Neg Hx   . Sexual abuse Neg Hx   . Physical abuse Neg Hx     Social History Social History   Tobacco Use  . Smoking status: Current Every Day Smoker    Packs/day: 0.50    Years: 9.00    Pack years: 4.50    Types: Cigarettes  . Smokeless tobacco: Never Used  Substance Use Topics  . Alcohol use: No  . Drug use: No     Allergies   Bupropion; Losartan; Metoprolol; Naproxen sodium; Sulfa antibiotics; and Prednisone   Review of Systems Review of  Systems  Constitutional: Negative for chills and fever.  Gastrointestinal: Negative for abdominal pain and vomiting.  Genitourinary: Negative for dysuria and flank pain.  Musculoskeletal: Positive for gait problem and joint swelling. Negative for back pain, neck pain and neck stiffness.  Skin: Negative for rash.  Neurological: Negative for light-headedness and headaches.     Physical Exam Updated Vital Signs BP (!) 145/82 (BP Location: Left Arm)   Pulse 71   Temp 98 F (36.7 C) (Oral)   Resp 16   Ht 5\' 6"  (1.676 m)   Wt 93.4 kg   SpO2 100%   BMI 33.25 kg/m   Physical Exam Vitals signs and nursing note reviewed.  Constitutional:      Appearance: She is well-developed.  HENT:     Head: Normocephalic and atraumatic.  Eyes:     General:        Right eye: No discharge.        Left eye: No discharge.     Conjunctiva/sclera: Conjunctivae normal.  Neck:     Musculoskeletal: Normal range of motion.     Trachea: No tracheal deviation.  Cardiovascular:     Rate and Rhythm: Normal rate.  Pulmonary:     Effort: Pulmonary effort is normal.  Abdominal:     Tenderness: There is no guarding.  Musculoskeletal:         General: Tenderness and signs of injury present.     Comments: Patient has tenderness to left great toe with mild ecchymosis and pain with flexion extension.  No other tenderness to proximal foot or ankle.  No other musculoskeletal injuries.  Skin:    General: Skin is warm.     Findings: No rash.  Neurological:     Mental Status: She is alert and oriented to person, place, and time.      ED Treatments / Results  Labs (all labs ordered are listed, but only abnormal results are displayed) Labs Reviewed - No data to display  EKG None  Radiology Dg Toe Great Left  Result Date: 05/26/2018 CLINICAL DATA:  Weight dropped on first toe EXAM: LEFT FIRST TOE: 3 VIEWS COMPARISON:  None. FINDINGS: Frontal, oblique, and lateral views were obtained. There is a comminuted fracture of the first distal phalanx. A portion of the fracture extends into the first IP joint. Alignment of the fracture fragments is essentially anatomic, except for a bony fragment no located medial to the proximal metaphysis-diaphysis junction. No other fractures. No dislocation. Joint spaces appear normal. No erosive change. IMPRESSION: Comminuted fracture first distal phalanx. There is an avulsed fragment medially at the proximal metaphysis-diaphysis level. A fracture fragments otherwise are in essentially anatomic alignment. A portion the fracture extends to the first IP joint. No other fractures. No dislocations. No appreciable underlying arthropathic change. Electronically Signed   By: Bretta BangWilliam  Woodruff III M.D.   On: 05/26/2018 07:17    Procedures Procedures (including critical care time)  Medications Ordered in ED Medications - No data to display   Initial Impression / Assessment and Plan / ED Course  I have reviewed the triage vital signs and the nursing notes.  Pertinent labs & imaging results that were available during my care of the patient were reviewed by me and considered in my medical decision making (see  chart for details).    Patient presents with isolated musculoskeletal injury.  X-ray reviewed showing comminuted fracture.  Hard soled shoe provided.  Pain meds as needed.  Discussed follow-up with orthopedics and no work  for at least 1 week.  Final Clinical Impressions(s) / ED Diagnoses   Final diagnoses:  Closed fracture of distal phalanx of left great toe, initial encounter    ED Discharge Orders    None       Blane Ohara, MD 05/26/18 1131

## 2018-05-26 NOTE — Telephone Encounter (Signed)
[facility] [place], states provider Blane OharaJoshua Zavitz prescribed medication Norco for this patient, and had a question about the dosage. States the provider's name Blane OharaJoshua Zavitz is listed as our practice, our address, phone number, and fax number. We have not seen this patient to our knowledge. Per chart, patient was [facility] for work-related injury, toe fracture. Pharmacy [phone]

## 2018-06-24 ENCOUNTER — Other Ambulatory Visit (HOSPITAL_COMMUNITY): Payer: Self-pay | Admitting: Psychiatry

## 2018-07-10 ENCOUNTER — Encounter (HOSPITAL_COMMUNITY): Payer: Self-pay | Admitting: Psychiatry

## 2018-07-10 ENCOUNTER — Ambulatory Visit (HOSPITAL_COMMUNITY): Payer: BLUE CROSS/BLUE SHIELD | Admitting: Psychiatry

## 2018-07-10 VITALS — BP 136/82 | HR 76 | Ht 66.0 in | Wt 210.0 lb

## 2018-07-10 DIAGNOSIS — F321 Major depressive disorder, single episode, moderate: Secondary | ICD-10-CM

## 2018-07-10 MED ORDER — CITALOPRAM HYDROBROMIDE 20 MG PO TABS: 20.0000 mg | ORAL_TABLET | Freq: Two times a day (BID) | ORAL | 2 refills | Status: DC

## 2018-07-10 NOTE — Progress Notes (Signed)
BH MD/PA/NP OP Progress Note  07/10/2018 10:19 AM Joanne Frazier  MRN:  [id number]  Chief Complaint:  Chief Complaint    Depression; Anxiety; Follow-up     HPI: This patient is a 53 year old divorced white female who is living alone and [place].  She is currently working at [employer] in the [employer].  The patient returns after almost a year's absence.  She stated she did not have insurance for quite a while and did not have any way to pay for coming here.  She remained on Celexa 20 mg daily.  She states despite being on the medication her depression is gotten worse.  She is having a lot of financial issues.  She is grateful for her job and she is found a reasonable place to rent.  However her car was "dead."  She still owes money on the car and this is very stressful.  Right now she is borrowing a truck from her son.  She states that she is having a lot of crying spells.  She broke at 1 of her toes when she dropped something on her foot at work and for a while she was unable to sleep but the sleep is getting better.  She feels overwhelmed with financial stress and at times even has suicidal thoughts but claims she would not act on them.  We suggested going back up on the Celexa to 40 mg daily and she is willing to try this at least as a beginning measure.  She is afraid to try other medicines because she stated Wellbutrin and Cymbalta made her feel worse. Visit Diagnosis:    ICD-10-CM   1. 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 Depression   . Headache(784.0)   . Hearing impairment   . History of anemia   . Hypertension   . Irritable bowel syndrome   . Missed period 08/19/2013  . Obesity   . Thyroid disease    History reviewed. No pertinent surgical history.  Family Psychiatric History: See  below  Family History:  Family History  Problem Relation Age of Onset  . Depression Sister   . Depression Brother   . Depression Brother   . Alcohol abuse Paternal Uncle   . Cirrhosis Paternal Uncle   . Depression Maternal Grandmother        "Nervous Breakdown"  . Seizures Father   . ADD / ADHD Daughter   . ADD / ADHD Son   . Cancer Maternal Grandfather        colon  . Drug abuse Neg Hx   . Dementia Neg Hx   . Bipolar disorder Neg Hx   . Anxiety disorder Neg Hx   . OCD Neg Hx   . Paranoid behavior Neg Hx   . Schizophrenia Neg Hx   . Sexual abuse Neg Hx   . Physical abuse Neg Hx     Social History:  Social History   Socioeconomic History  . Marital status: Divorced    Spouse name: Not on file  . Number of children: Not on file  . Years of education: Not on file  . Highest education level: Not on file  Occupational History  . Not on file  Social Needs  . Financial resource strain: Not on file  . Food insecurity:    Worry: Not on  file    Inability: Not on file  . Transportation needs:    Medical: Not on file    Non-medical: Not on file  Tobacco Use  . Smoking status: Current Every Day Smoker    Packs/day: 0.50    Years: 9.00    Pack years: 4.50    Types: Cigarettes  . Smokeless tobacco: Never Used  Substance and Sexual Activity  . Alcohol use: No  . Drug use: No  . Sexual activity: Yes    Birth control/protection: None  Lifestyle  . Physical activity:    Days per week: Not on file    Minutes per session: Not on file  . Stress: Not on file  Relationships  . Social connections:    Talks on phone: Not on file    Gets together: Not on file    Attends religious service: Not on file    Active member of club or organization: Not on file    Attends meetings of clubs or organizations: Not on file    Relationship status: Not on file  Other Topics Concern  . Not on file  Social History Narrative  . Not on file    Allergies:  Allergies  Allergen  Reactions  . Bupropion Other (See Comments)    Depression  . Losartan Other (See Comments)    Jitteriness   . Metoprolol Nausea And Vomiting  . Naproxen Sodium Other (See Comments)    Funny feeling  . Sulfa Antibiotics Other (See Comments)    Unknown.   . Prednisone Other (See Comments) and Palpitations    Shakiness    Metabolic Disorder Labs: No results found for: HGBA1C, MPG No results found for: PROLACTIN Lab Results  Component Value Date   CHOL 156 08/19/2013   TRIG 196 (H) 08/19/2013   HDL 26 (L) 08/19/2013   CHOLHDL 6.0 08/19/2013   VLDL 39 08/19/2013   LDLCALC 91 08/19/2013   Lab Results  Component Value Date   TSH 2.668 08/19/2013    Therapeutic Level Labs: No results found for: LITHIUM No results found for: VALPROATE No components found for:  CBMZ  Current Medications: Current Outpatient Medications  Medication Sig Dispense Refill  . citalopram (CELEXA) 20 MG tablet Take 1 tablet (20 mg total) by mouth 2 (two) times daily. 60 tablet 2  . phenylephrine (SUDAFED PE) 10 MG TABS tablet Take 10 mg by mouth every 4 (four) hours as needed (decongestion).     No current facility-administered medications for this visit.      Musculoskeletal: Strength & Muscle Tone: within normal limits Gait & Station: normal Patient leans: N/A  Psychiatric Specialty Exam: Review of Systems  Constitutional: Positive for weight loss.  HENT: Positive for hearing loss.   Musculoskeletal: Positive for joint pain.  Psychiatric/Behavioral: Positive for depression. The patient is nervous/anxious.   All other systems reviewed and are negative.   Blood pressure 136/82, pulse 76, height 5\' 6"  (1.676 m), weight 210 lb (95.3 kg), SpO2 99 %.Body mass index is 33.89 kg/m.  General Appearance: Casual and Fairly Groomed  Eye Contact:  Good  Speech:  Clear and Coherent  Volume:  Normal  Mood:  Depressed  Affect:  Depressed and Tearful  Thought Process:  Goal Directed  Orientation:   Full (Time, Place, and Person)  Thought Content: Rumination   Suicidal Thoughts:  No  Homicidal Thoughts:  No  Memory:  Immediate;   Good Recent;   Good Remote;   Good  Judgement:  Good  Insight:  Fair  Psychomotor Activity:  Decreased  Concentration:  Concentration: Good and Attention Span: Good  Recall:  Good  Fund of Knowledge: Good  Language: Good  Akathisia:  No  Handed:  Right  AIMS (if indicated): not done  Assets:  Communication Skills Desire for Improvement Physical Health Resilience Social Support Talents/Skills  ADL's:  Intact  Cognition: WNL  Sleep:  Fair   Screenings:   Assessment and Plan: This patient is a 53 year old female with a history of depression.  She is now experiencing decreased appetite anhedonia crying spells and anxiety over finances.  I suggested that we increase her Celexa to 40 mg daily and she is in agreement.  She will return to see me in 6 weeks but call sooner if anything gets worse.   Diannia Rudereborah Yahira Timberman, MD 07/10/2018, 10:19 AM

## 2018-08-21 ENCOUNTER — Ambulatory Visit (HOSPITAL_COMMUNITY): Payer: Self-pay | Admitting: Psychiatry

## 2018-09-11 ENCOUNTER — Other Ambulatory Visit: Payer: Self-pay

## 2018-09-11 ENCOUNTER — Ambulatory Visit (HOSPITAL_COMMUNITY): Payer: BLUE CROSS/BLUE SHIELD | Admitting: Psychiatry

## 2018-10-07 ENCOUNTER — Other Ambulatory Visit: Payer: Self-pay

## 2018-10-07 ENCOUNTER — Encounter (HOSPITAL_COMMUNITY): Payer: Self-pay | Admitting: Psychiatry

## 2018-10-07 ENCOUNTER — Ambulatory Visit (INDEPENDENT_AMBULATORY_CARE_PROVIDER_SITE_OTHER): Payer: BLUE CROSS/BLUE SHIELD | Admitting: Psychiatry

## 2018-10-07 DIAGNOSIS — F321 Major depressive disorder, single episode, moderate: Secondary | ICD-10-CM

## 2018-10-07 MED ORDER — CITALOPRAM HYDROBROMIDE 20 MG PO TABS: 30.0000 mg | ORAL_TABLET | Freq: Every day | ORAL | 2 refills | Status: DC

## 2018-10-07 NOTE — Progress Notes (Signed)
Virtual Visit via Telephone Note  I connected with Joanne Frazier on 10/07/18 at  3:20 PM EDT by telephone and verified that I am speaking with the correct person using two identifiers.   I discussed the limitations, risks, security and privacy concerns of performing an evaluation and management service by telephone and the availability of in person appointments. I also discussed with the patient that there may be a patient responsible charge related to this service. The patient expressed understanding and agreed to proceed.       I discussed the assessment and treatment plan with the patient. The patient was provided an opportunity to ask questions and all were answered. The patient agreed with the plan and demonstrated an understanding of the instructions.   The patient was advised to call back or seek an in-person evaluation if the symptoms worsen or if the condition fails to improve as anticipated.  I provided 15 minutes of non-face-to-face time during this encounter.   Diannia Ruder, MD  [facility] MD/PA/NP OP Progress Note  10/07/2018 3:26 PM Joanne Frazier  MRN:  [id number]  Chief Complaint:  Chief Complaint    Depression; Anxiety; Follow-up     HPI: This patient is a 53 year old divorced white female who is living alone and [place].  She currently works at [employer] in the [employer].  The patient returns after 3 months.  When I last saw her it had been after long absence because she had lost her insurance.  We did restart her Celexa at a higher dosage because she seemed more depressed.  She states now she is doing very well.  She really likes her job and enjoys the hours and having 2 days off a week.  She is borrowing her son's truck at the moment but since her car had to go back with many things wrong with it. She states that her mood has been good but she would like to reduce her Celxa dose due to weight gain Visit Diagnosis:    ICD-10-CM   1. Moderate single current  episode of major depressive disorder (HCC) F32.1     Past Psychiatric History 1 prior outpatient hospitalization  Past Medical History:  Past Medical History:  Diagnosis Date  . Depression   . Headache(784.0)   . Hearing impairment   . History of anemia   . Hypertension   . Irritable bowel syndrome   . Missed period 08/19/2013  . Obesity   . Thyroid disease    History reviewed. No pertinent surgical history.  Family Psychiatric History: See below  Family History:  Family History  Problem Relation Age of Onset  . Depression Sister   . Depression Brother   . Depression Brother   . Alcohol abuse Paternal Uncle   . Cirrhosis Paternal Uncle   . Depression Maternal Grandmother        "Nervous Breakdown"  . Seizures Father   . ADD / ADHD Daughter   . ADD / ADHD Son   . Cancer Maternal Grandfather        colon  . Drug abuse Neg Hx   . Dementia Neg Hx   . Bipolar disorder Neg Hx   . Anxiety disorder Neg Hx   . OCD Neg Hx   . Paranoid behavior Neg Hx   . Schizophrenia Neg Hx   . Sexual abuse Neg Hx   . Physical abuse Neg Hx     Social History:  Social History   Socioeconomic History  .  Marital status: Divorced    Spouse name: Not on file  . Number of children: Not on file  . Years of education: Not on file  . Highest education level: Not on file  Occupational History  . Not on file  Social Needs  . Financial resource strain: Not on file  . Food insecurity:    Worry: Not on file    Inability: Not on file  . Transportation needs:    Medical: Not on file    Non-medical: Not on file  Tobacco Use  . Smoking status: Current Every Day Smoker    Packs/day: 0.50    Years: 9.00    Pack years: 4.50    Types: Cigarettes  . Smokeless tobacco: Never Used  Substance and Sexual Activity  . Alcohol use: No  . Drug use: No  . Sexual activity: Yes    Birth control/protection: None  Lifestyle  . Physical activity:    Days per week: Not on file    Minutes per session:  Not on file  . Stress: Not on file  Relationships  . Social connections:    Talks on phone: Not on file    Gets together: Not on file    Attends religious service: Not on file    Active member of club or organization: Not on file    Attends meetings of clubs or organizations: Not on file    Relationship status: Not on file  Other Topics Concern  . Not on file  Social History Narrative  . Not on file    Allergies:  Allergies  Allergen Reactions  . Bupropion Other (See Comments)    Depression  . Losartan Other (See Comments)    Jitteriness   . Metoprolol Nausea And Vomiting  . Naproxen Sodium Other (See Comments)    Funny feeling  . Sulfa Antibiotics Other (See Comments)    Unknown.   . Prednisone Other (See Comments) and Palpitations    Shakiness    Metabolic Disorder Labs: No results found for: HGBA1C, MPG No results found for: PROLACTIN Lab Results  Component Value Date   CHOL 156 08/19/2013   TRIG 196 (H) 08/19/2013   HDL 26 (L) 08/19/2013   CHOLHDL 6.0 08/19/2013   VLDL 39 08/19/2013   LDLCALC 91 08/19/2013   Lab Results  Component Value Date   TSH 2.668 08/19/2013    Therapeutic Level Labs: No results found for: LITHIUM No results found for: VALPROATE No components found for:  CBMZ  Current Medications: Current Outpatient Medications  Medication Sig Dispense Refill  . citalopram (CELEXA) 20 MG tablet Take 1.5 tablets (30 mg total) by mouth daily. 45 tablet 2  . phenylephrine (SUDAFED PE) 10 MG TABS tablet Take 10 mg by mouth every 4 (four) hours as needed (decongestion).     No current facility-administered medications for this visit.      Musculoskeletal: Strength & Muscle Tone:n/a  Gait & Station: Patient leans:  Psychiatric Specialty Exam: Review of Systems  Neurological: Positive for tingling.  All other systems reviewed and are negative.   There were no vitals taken for this visit.There is no height or weight on file to calculate BMI.   General Appearance: NA  Eye Contact:  NA  Speech:  Clear and Coherent  Volume:  Normal  Mood:  Euthymic  Affect:  NA  Thought Process:  Goal Directed  Orientation:  Full (Time, Place, and Person)  Thought Content: Rumination   Suicidal Thoughts:  No  Homicidal Thoughts:  No  Memory:  Immediate;   Good Recent;   Good Remote;   Fair  Judgement:  Good  Insight:  Fair  Psychomotor Activity:  Normal  Concentration:  Concentration: Good and Attention Span: Good  Recall:  Good  Fund of Knowledge: Good  Language: Good  Akathisia:  No  Handed:  Right  AIMS (if indicated): not done  Assets:  Communication Skills Desire for Improvement Physical Health Resilience Social Support Talents/Skills  ADL's:  Intact  Cognition: WNL  Sleep:  Fair   Screenings:   Assessment and Plan:  This patient is a 53 year old female with a history of depression. She is doing well on celexa but requests a dose decrease due to weight gain. Will decrease to 30 mg daily, RTC 3 months  Diannia Rudereborah Ross, MD 10/07/2018, 3:26 PM

## 2019-01-06 ENCOUNTER — Encounter (HOSPITAL_COMMUNITY): Payer: Self-pay | Admitting: Psychiatry

## 2019-01-06 ENCOUNTER — Ambulatory Visit (INDEPENDENT_AMBULATORY_CARE_PROVIDER_SITE_OTHER): Payer: BC Managed Care – PPO | Admitting: Psychiatry

## 2019-01-06 ENCOUNTER — Other Ambulatory Visit: Payer: Self-pay

## 2019-01-06 DIAGNOSIS — F321 Major depressive disorder, single episode, moderate: Secondary | ICD-10-CM

## 2019-01-06 MED ORDER — CITALOPRAM HYDROBROMIDE 40 MG PO TABS: 40.0000 mg | ORAL_TABLET | Freq: Every day | ORAL | 2 refills | Status: DC

## 2019-01-06 MED ORDER — LAMOTRIGINE 25 MG PO TABS: ORAL_TABLET | ORAL | 2 refills | Status: DC

## 2019-01-06 NOTE — Progress Notes (Signed)
BH MD/PA/NP OP Progress Note  01/06/2019 3:36 PM Joanne Frazier  MRN:  [id number]  Chief Complaint:  Chief Complaint    Depression; Anxiety; Follow-up     HPI: Virtual Visit via Telephone Note  I connected with Joanne Frazier on 01/06/19 at  3:20 PM EDT by telephone and verified that I am speaking with the correct person using two identifiers.   I discussed the limitations, risks, security and privacy concerns of performing an evaluation and management service by telephone and the availability of in person appointments. I also discussed with the patient that there may be a patient responsible charge related to this service. The patient expressed understanding and agreed to proceed.      I discussed the assessment and treatment plan with the patient. The patient was provided an opportunity to ask questions and all were answered. The patient agreed with the plan and demonstrated an understanding of the instructions.   The patient was advised to call back or seek an in-person evaluation if the symptoms worsen or if the condition fails to improve as anticipated.  I provided 15 minutes of non-face-to-face time during this encounter.   Levonne Spiller, MD    Visit Diagnosis:    ICD-10-CM   1. Moderate single current episode of major depressive disorder [facility])  F32.1    This patient is a 53 year old divorced white female who lives alone in [place].  She currently works at [employer] in the [employer].  The patient returns after 3 months.  She states that she is not doing well.  She states that she has been depressed self isolating not wanting to be around people.  She either sleeps too much or cannot sleep because of racing thoughts.  She denies any current thoughts of self-harm.  Last time we had cut her Celexa down from 40-30 because of weight gain.  At the time she sounded really good but I do not think this dosage is working for her and I strongly suggested she go back up to 40  mg and she agrees.  She also asked about something to help her racing thoughts.  In looking back to her record she had been on Lamictal before and it seems to have helped some so we will restart this as well. Past Psychiatric History: 1 prior psychiatric hospitalization in 2007  Past Medical History:  Past Medical History:  Diagnosis Date  . Depression   . Headache(784.0)   . Hearing impairment   . History of anemia   . Hypertension   . Irritable bowel syndrome   . Missed period 08/19/2013  . Obesity   . Thyroid disease    History reviewed. No pertinent surgical history.  Family Psychiatric History: see below  Family History:  Family History  Problem Relation Age of Onset  . Depression Sister   . Depression Brother   . Depression Brother   . Alcohol abuse Paternal Uncle   . Cirrhosis Paternal Uncle   . Depression Maternal Grandmother        "Nervous Breakdown"  . Seizures Father   . ADD / ADHD Daughter   . ADD / ADHD Son   . Cancer Maternal Grandfather        colon  . Drug abuse Neg Hx   . Dementia Neg Hx   . Bipolar disorder Neg Hx   . Anxiety disorder Neg Hx   . OCD Neg Hx   . Paranoid behavior Neg Hx   . Schizophrenia  Neg Hx   . Sexual abuse Neg Hx   . Physical abuse Neg Hx     Social History:  Social History   Socioeconomic History  . Marital status: Divorced    Spouse name: Not on file  . Number of children: Not on file  . Years of education: Not on file  . Highest education level: Not on file  Occupational History  . Not on file  Social Needs  . Financial resource strain: Not on file  . Food insecurity    Worry: Not on file    Inability: Not on file  . Transportation needs    Medical: Not on file    Non-medical: Not on file  Tobacco Use  . Smoking status: Current Every Day Smoker    Packs/day: 0.50    Years: 9.00    Pack years: 4.50    Types: Cigarettes  . Smokeless tobacco: Never Used  Substance and Sexual Activity  . Alcohol use: No  .  Drug use: No  . Sexual activity: Yes    Birth control/protection: None  Lifestyle  . Physical activity    Days per week: Not on file    Minutes per session: Not on file  . Stress: Not on file  Relationships  . Social Musicianconnections    Talks on phone: Not on file    Gets together: Not on file    Attends religious service: Not on file    Active member of club or organization: Not on file    Attends meetings of clubs or organizations: Not on file    Relationship status: Not on file  Other Topics Concern  . Not on file  Social History Narrative  . Not on file    Allergies:  Allergies  Allergen Reactions  . Bupropion Other (See Comments)    Depression  . Losartan Other (See Comments)    Jitteriness   . Metoprolol Nausea And Vomiting  . Naproxen Sodium Other (See Comments)    Funny feeling  . Sulfa Antibiotics Other (See Comments)    Unknown.   . Prednisone Other (See Comments) and Palpitations    Shakiness    Metabolic Disorder Labs: No results found for: HGBA1C, MPG No results found for: PROLACTIN Lab Results  Component Value Date   CHOL 156 08/19/2013   TRIG 196 (H) 08/19/2013   HDL 26 (L) 08/19/2013   CHOLHDL 6.0 08/19/2013   VLDL 39 08/19/2013   LDLCALC 91 08/19/2013   Lab Results  Component Value Date   TSH 2.668 08/19/2013    Therapeutic Level Labs: No results found for: LITHIUM No results found for: VALPROATE No components found for:  CBMZ  Current Medications: Current Outpatient Medications  Medication Sig Dispense Refill  . citalopram (CELEXA) 40 MG tablet Take 1 tablet (40 mg total) by mouth daily. 30 tablet 2  . lamoTRIgine (LAMICTAL) 25 MG tablet Take at bedtime for 2 weeks, then increase to one twice daily 30 tablet 2  . phenylephrine (SUDAFED PE) 10 MG TABS tablet Take 10 mg by mouth every 4 (four) hours as needed (decongestion).     No current facility-administered medications for this visit.      Musculoskeletal: Strength & Muscle Tone:  within normal limits Gait & Station: normal Patient leans: N/A  Psychiatric Specialty Exam: Review of Systems  Psychiatric/Behavioral: Positive for depression. The patient is nervous/anxious and has insomnia.   All other systems reviewed and are negative.   There were no vitals taken  for this visit.There is no height or weight on file to calculate BMI.  General Appearance: NA  Eye Contact:  NA  Speech:  Clear and Coherent  Volume:  Normal  Mood:  Anxious and Depressed  Affect:  NA  Thought Process:  Goal Directed  Orientation:  Full (Time, Place, and Person)  Thought Content: Rumination   Suicidal Thoughts:  No  Homicidal Thoughts:  No  Memory:  Immediate;   Good Recent;   Good Remote;   Fair  Judgement:  Fair  Insight:  Fair  Psychomotor Activity:  Decreased  Concentration:  Concentration: Fair and Attention Span: Fair  Recall:  FiservFair  Fund of Knowledge: Fair  Language: Good  Akathisia:  No  Handed:  Right  AIMS (if indicated): not done  Assets:  Communication Skills Desire for Improvement Physical Health Resilience Social Support Talents/Skills  ADL's:  Intact  Cognition: WNL  Sleep:  Poor   Screenings:   Assessment and Plan:  This patient is a 53 year old female with a history of depression and mood swings.  She is not doing well on the lower dose of Celexa and will go back to 40 mg daily.  We will also add Lamictal 25 mg daily for 2 weeks and then increase to 25 mg twice daily for mood stabilization.  She will return to see me in 3 weeks  Diannia Rudereborah Matraca Hunkins, MD 01/06/2019, 3:36 PM

## 2019-01-29 ENCOUNTER — Encounter (HOSPITAL_COMMUNITY): Payer: Self-pay | Admitting: Psychiatry

## 2019-01-29 ENCOUNTER — Other Ambulatory Visit: Payer: Self-pay

## 2019-01-29 ENCOUNTER — Ambulatory Visit (INDEPENDENT_AMBULATORY_CARE_PROVIDER_SITE_OTHER): Payer: BC Managed Care – PPO | Admitting: Psychiatry

## 2019-01-29 DIAGNOSIS — F419 Anxiety disorder, unspecified: Secondary | ICD-10-CM | POA: Diagnosis not present

## 2019-01-29 DIAGNOSIS — F321 Major depressive disorder, single episode, moderate: Secondary | ICD-10-CM | POA: Diagnosis not present

## 2019-01-29 MED ORDER — LAMOTRIGINE 25 MG PO TABS: 25.0000 mg | ORAL_TABLET | Freq: Two times a day (BID) | ORAL | 2 refills | Status: DC

## 2019-01-29 MED ORDER — CITALOPRAM HYDROBROMIDE 40 MG PO TABS: 40.0000 mg | ORAL_TABLET | Freq: Every day | ORAL | 2 refills | Status: DC

## 2019-01-29 NOTE — Progress Notes (Signed)
Virtual Visit via Telephone Note  I connected with Joanne Frazier on 01/29/19 at  3:20 PM EDT by telephone and verified that I am speaking with the correct person using two identifiers.   I discussed the limitations, risks, security and privacy concerns of performing an evaluation and management service by telephone and the availability of in person appointments. I also discussed with the patient that there may be a patient responsible charge related to this service. The patient expressed understanding and agreed to proceed.     I discussed the assessment and treatment plan with the patient. The patient was provided an opportunity to ask questions and all were answered. The patient agreed with the plan and demonstrated an understanding of the instructions.   The patient was advised to call back or seek an in-person evaluation if the symptoms worsen or if the condition fails to improve as anticipated.  I provided 15 minutes of non-face-to-face time during this encounter.   Diannia Rudereborah Ross, MD  [facility] Progress [facility]  01/29/2019 3:46 PM Joanne Frazier  MRN:  [id number]  Chief Complaint:  Chief Complaint    Depression; Anxiety; Follow-up     HPI: This patient is a 53 year old divorced white female who lives alone in [place].  She currently works in [employer] at the [employer].  The patient returns after 3 weeks.  She states that she is doing better.  Last time she was depressed and very anxious unable to sleep and having racing thoughts.  I increased her Celexa back to 40 mg and she states she is now looks depressed.  She is also on Lamictal 25 mg twice daily which he states is really helping her racing thoughts and she is sleeping better.  She is still having some trouble with social anxiety.  She feels quite anxious when she leaves the house but she is making herself do it.  She is supposed to go back to work tomorrow even though she feels very anxious about it she knows she  has to go back and she is going to try to make the best of it.  She denies any thoughts of self-harm or suicide Visit Diagnosis:    ICD-10-CM   1. Moderate single current episode of major depressive disorder (HCC)  F32.1     Past Psychiatric History: 1 prior psychiatric hospitalization in 2007  Past Medical History:  Past Medical History:  Diagnosis Date  . Depression   . Headache(784.0)   . Hearing impairment   . History of anemia   . Hypertension   . Irritable bowel syndrome   . Missed period 08/19/2013  . Obesity   . Thyroid disease    History reviewed. No pertinent surgical history.  Family Psychiatric History: See below  Family History:  Family History  Problem Relation Age of Onset  . Depression Sister   . Depression Brother   . Depression Brother   . Alcohol abuse Paternal Uncle   . Cirrhosis Paternal Uncle   . Depression Maternal Grandmother        "Nervous Breakdown"  . Seizures Father   . ADD / ADHD Daughter   . ADD / ADHD Son   . Cancer Maternal Grandfather        colon  . Drug abuse Neg Hx   . Dementia Neg Hx   . Bipolar disorder Neg Hx   . Anxiety disorder Neg Hx   . OCD Neg Hx   . Paranoid behavior Neg Hx   .  Schizophrenia Neg Hx   . Sexual abuse Neg Hx   . Physical abuse Neg Hx     Social History:  Social History   Socioeconomic History  . Marital status: Divorced    Spouse name: Not on file  . Number of children: Not on file  . Years of education: Not on file  . Highest education level: Not on file  Occupational History  . Not on file  Social Needs  . Financial resource strain: Not on file  . Food insecurity    Worry: Not on file    Inability: Not on file  . Transportation needs    Medical: Not on file    Non-medical: Not on file  Tobacco Use  . Smoking status: Current Every Day Smoker    Packs/day: 0.50    Years: 9.00    Pack years: 4.50    Types: Cigarettes  . Smokeless tobacco: Never Used  Substance and Sexual Activity  .  Alcohol use: No  . Drug use: No  . Sexual activity: Yes    Birth control/protection: None  Lifestyle  . Physical activity    Days per week: Not on file    Minutes per session: Not on file  . Stress: Not on file  Relationships  . Social Herbalist on phone: Not on file    Gets together: Not on file    Attends religious service: Not on file    Active member of club or organization: Not on file    Attends meetings of clubs or organizations: Not on file    Relationship status: Not on file  Other Topics Concern  . Not on file  Social History Narrative  . Not on file    Allergies:  Allergies  Allergen Reactions  . Bupropion Other (See Comments)    Depression  . Losartan Other (See Comments)    Jitteriness   . Metoprolol Nausea And Vomiting  . Naproxen Sodium Other (See Comments)    Funny feeling  . Sulfa Antibiotics Other (See Comments)    Unknown.   . Prednisone Other (See Comments) and Palpitations    Shakiness    Metabolic Disorder Labs: No results found for: HGBA1C, MPG No results found for: PROLACTIN Lab Results  Component Value Date   CHOL 156 08/19/2013   TRIG 196 (H) 08/19/2013   HDL 26 (L) 08/19/2013   CHOLHDL 6.0 08/19/2013   VLDL 39 08/19/2013   LDLCALC 91 08/19/2013   Lab Results  Component Value Date   TSH 2.668 08/19/2013    Therapeutic Level Labs: No results found for: LITHIUM No results found for: VALPROATE No components found for:  CBMZ  Current Medications: Current Outpatient Medications  Medication Sig Dispense Refill  . citalopram (CELEXA) 40 MG tablet Take 1 tablet (40 mg total) by mouth daily. 30 tablet 2  . lamoTRIgine (LAMICTAL) 25 MG tablet Take 1 tablet (25 mg total) by mouth 2 (two) times daily. Take at bedtime for 2 weeks, then increase to one twice daily 60 tablet 2  . phenylephrine (SUDAFED PE) 10 MG TABS tablet Take 10 mg by mouth every 4 (four) hours as needed (decongestion).     No current facility-administered  medications for this visit.      Musculoskeletal: Strength & Muscle Tone: within normal limits Gait & Station: normal Patient leans: N/A  Psychiatric Specialty Exam: Review of Systems  Psychiatric/Behavioral: The patient is nervous/anxious.   All other systems reviewed and are negative.  There were no vitals taken for this visit.There is no height or weight on file to calculate BMI.  General Appearance: NA  Eye Contact:  NA  Speech:  Clear and Coherent  Volume:  Normal  Mood:  Anxious  Affect:  NA  Thought Process:  Goal Directed  Orientation:  Full (Time, Place, and Person)  Thought Content: Rumination   Suicidal Thoughts:  No  Homicidal Thoughts:  No  Memory:  Immediate;   Good Recent;   Good Remote;   Fair  Judgement:  Fair  Insight:  Fair  Psychomotor Activity:  Normal  Concentration:  Concentration: Good and Attention Span: Good  Recall:  Good  Fund of Knowledge: Good  Language: Good  Akathisia:  No  Handed:  Right  AIMS (if indicated): not done  Assets:  Communication Skills Desire for Improvement Physical Health Resilience Social Support Talents/Skills  ADL's:  Intact  Cognition: WNL  Sleep:  Good   Screenings:   Assessment and Plan: This patient is a 53 year old female with a history of depression and mood swings.  She is doing better on the higher dose of Celexa and will continue at 40 mg daily along with Lamictal 25 mg twice daily for mood stabilization.  She voices no side effects from Lamictal.  She feels able to return to work tomorrow and I will fill out paperwork in this regard.  She will return to see me in 4 weeks   Diannia Rudereborah Ross, MD 01/29/2019, 3:46 PM

## 2019-02-02 ENCOUNTER — Telehealth (HOSPITAL_COMMUNITY): Payer: Self-pay | Admitting: *Deleted

## 2019-02-02 NOTE — Telephone Encounter (Signed)
You can try to have Joanne Frazier scan me the form tomorrow as I won't be until Friday the 28th

## 2019-02-02 NOTE — Telephone Encounter (Signed)
PATIENT CAME INTO OFFICE TODAY UPSET THAT HER FAX SHE WAS TOLD DIDN'T COME EARLIER ENOUGH  FOR HER TO RETURN TO WORK ON SATURDAY. SO SHE WAS TOLD BY HER [employer] TO BRING IN A WORK EXCUSE  & RETURN TO WORK NOTICE. SO THE FOLLOWING DATES WERE GIVING TO HER BY [employer] OUT OF WORK 821/2020 THRU 02/06/2019. RETURN TO WORK 02/07/2019

## 2019-02-02 NOTE — Telephone Encounter (Signed)
PATIENT CAME INTO OFFICE TODAY UPSET THAT HER FAX SHE WAS TOLD DIDN'T COME EARLIER ENOUGH  FOR HER TO RETURN TO WORK ON SATURDAY. SO SHE WAS TOLD BY HER H.R[employer] TO BRING IN A WORK EXCUSE  & RETURN TO WORK NOTICE. SO THE FOLLOWING DATES WERE GIVING TO HER BY [employer] OUT OF WORK 821/2020 THRU 02/06/2019. RETURN TO WORK 02/07/2019   

## 2019-02-03 NOTE — Telephone Encounter (Signed)
I did inform patient that it may be Friday again before she's able to pick up a work excuse.

## 2019-02-03 NOTE — Telephone Encounter (Signed)
ok 

## 2019-03-02 ENCOUNTER — Other Ambulatory Visit: Payer: Self-pay

## 2019-03-02 ENCOUNTER — Encounter (HOSPITAL_COMMUNITY): Payer: Self-pay | Admitting: Psychiatry

## 2019-03-02 ENCOUNTER — Ambulatory Visit (INDEPENDENT_AMBULATORY_CARE_PROVIDER_SITE_OTHER): Payer: BC Managed Care – PPO | Admitting: Psychiatry

## 2019-03-02 DIAGNOSIS — F321 Major depressive disorder, single episode, moderate: Secondary | ICD-10-CM | POA: Diagnosis not present

## 2019-03-02 MED ORDER — CITALOPRAM HYDROBROMIDE 40 MG PO TABS: 40.0000 mg | ORAL_TABLET | Freq: Every day | ORAL | 2 refills | Status: DC

## 2019-03-02 MED ORDER — LAMOTRIGINE 25 MG PO TABS: 25.0000 mg | ORAL_TABLET | Freq: Two times a day (BID) | ORAL | 2 refills | Status: DC

## 2019-03-02 MED ORDER — LORAZEPAM 0.5 MG PO TABS: 0.5000 mg | ORAL_TABLET | Freq: Every day | ORAL | 2 refills | Status: DC | PRN

## 2019-03-02 NOTE — Progress Notes (Signed)
Virtual Visit via Telephone Note  I connected with Joanne Frazier on 03/02/19 at  3:20 PM EDT by telephone and verified that I am speaking with the correct person using two identifiers.   I discussed the limitations, risks, security and privacy concerns of performing an evaluation and management service by telephone and the availability of in person appointments. I also discussed with the patient that there may be a patient responsible charge related to this service. The patient expressed understanding and agreed to proceed.     I discussed the assessment and treatment plan with the patient. The patient was provided an opportunity to ask questions and all were answered. The patient agreed with the plan and demonstrated an understanding of the instructions.   The patient was advised to call back or seek an in-person evaluation if the symptoms worsen or if the condition fails to improve as anticipated.  I provided 15 minutes of non-face-to-face time during this encounter.   Diannia Ruder, MD  [facility] MD/PA/NP OP Progress Note  03/02/2019 3:41 PM ALEICIA Frazier  MRN:  [id number]  Chief Complaint:  Chief Complaint    Depression; Anxiety; Follow-up     HPI: This patient is a 53 year old divorced white female who lives alone in [place].  She currently works in [employer] in the [employer].  The patient returns after 4 weeks.  She was out for the entire month of August due to severe depression and anxiety.  I increased her Celexa back to 40 mg and her depression has lessened.  Lamictal has helped her racing thoughts and she is sleeping fairly well.  She is still having significant problems with anxiety and leaving the house and going to work.  However she claims that she has to go to work because she will get terminated if she does not and someone has to pay her bills.  She to states that she has hearing loss and when she is at work she cannot understand customers with her masks on and they  get angry and agitated and this is caused a lot of anxiety.  The HR department knows about it and told her not to worry about it and direct the customers to someone else.  She has missed 4 days of work over the last week due to anxiety.  I explained to the patient that perhaps we let her go back to work too soon but she claims not.  Therefore I stated that she would need to try to go to work each day.  I will give her a low-dose of Ativan to try to see if it will help alleviate the acute anxiety and also suggested she get the free counseling that [facility] offers.  She denies any thoughts of self-harm or suicidal ideation. Visit Diagnosis:    ICD-10-CM   1. Moderate single current episode of major depressive disorder (HCC)  F32.1     Past Psychiatric History: Prior psychiatric hospitalization in 2007  Past Medical History:  Past Medical History:  Diagnosis Date  . Depression   . Headache(784.0)   . Hearing impairment   . History of anemia   . Hypertension   . Irritable bowel syndrome   . Missed period 08/19/2013  . Obesity   . Thyroid disease    History reviewed. No pertinent surgical history.  Family Psychiatric History: see below  Family History:  Family History  Problem Relation Age of Onset  . Depression Sister   . Depression Brother   .  Depression Brother   . Alcohol abuse Paternal Uncle   . Cirrhosis Paternal Uncle   . Depression Maternal Grandmother        "Nervous Breakdown"  . Seizures Father   . ADD / ADHD Daughter   . ADD / ADHD Son   . Cancer Maternal Grandfather        colon  . Drug abuse Neg Hx   . Dementia Neg Hx   . Bipolar disorder Neg Hx   . Anxiety disorder Neg Hx   . OCD Neg Hx   . Paranoid behavior Neg Hx   . Schizophrenia Neg Hx   . Sexual abuse Neg Hx   . Physical abuse Neg Hx     Social History:  Social History   Socioeconomic History  . Marital status: Divorced    Spouse name: Not on file  . Number of children: Not on file  . Years of  education: Not on file  . Highest education level: Not on file  Occupational History  . Not on file  Social Needs  . Financial resource strain: Not on file  . Food insecurity    Worry: Not on file    Inability: Not on file  . Transportation needs    Medical: Not on file    Non-medical: Not on file  Tobacco Use  . Smoking status: Current Every Day Smoker    Packs/day: 0.50    Years: 9.00    Pack years: 4.50    Types: Cigarettes  . Smokeless tobacco: Never Used  Substance and Sexual Activity  . Alcohol use: No  . Drug use: No  . Sexual activity: Yes    Birth control/protection: None  Lifestyle  . Physical activity    Days per week: Not on file    Minutes per session: Not on file  . Stress: Not on file  Relationships  . Social Musician on phone: Not on file    Gets together: Not on file    Attends religious service: Not on file    Active member of club or organization: Not on file    Attends meetings of clubs or organizations: Not on file    Relationship status: Not on file  Other Topics Concern  . Not on file  Social History Narrative  . Not on file    Allergies:  Allergies  Allergen Reactions  . Bupropion Other (See Comments)    Depression  . Losartan Other (See Comments)    Jitteriness   . Metoprolol Nausea And Vomiting  . Naproxen Sodium Other (See Comments)    Funny feeling  . Sulfa Antibiotics Other (See Comments)    Unknown.   . Prednisone Other (See Comments) and Palpitations    Shakiness    Metabolic Disorder Labs: No results found for: HGBA1C, MPG No results found for: PROLACTIN Lab Results  Component Value Date   CHOL 156 08/19/2013   TRIG 196 (H) 08/19/2013   HDL 26 (L) 08/19/2013   CHOLHDL 6.0 08/19/2013   VLDL 39 08/19/2013   LDLCALC 91 08/19/2013   Lab Results  Component Value Date   TSH 2.668 08/19/2013    Therapeutic Level Labs: No results found for: LITHIUM No results found for: VALPROATE No components found  for:  CBMZ  Current Medications: Current Outpatient Medications  Medication Sig Dispense Refill  . citalopram (CELEXA) 40 MG tablet Take 1 tablet (40 mg total) by mouth daily. 30 tablet 2  . lamoTRIgine (LAMICTAL)  25 MG tablet Take 1 tablet (25 mg total) by mouth 2 (two) times daily. Take at bedtime for 2 weeks, then increase to one twice daily 60 tablet 2  . LORazepam (ATIVAN) 0.5 MG tablet Take 1 tablet (0.5 mg total) by mouth daily as needed for anxiety. 30 tablet 2  . phenylephrine (SUDAFED PE) 10 MG TABS tablet Take 10 mg by mouth every 4 (four) hours as needed (decongestion).     No current facility-administered medications for this visit.      Musculoskeletal: Strength & Muscle Tone: within normal limits Gait & Station: normal Patient leans: N/A  Psychiatric Specialty Exam: Review of Systems  HENT: Positive for hearing loss.   Psychiatric/Behavioral: The patient is nervous/anxious.   All other systems reviewed and are negative.   There were no vitals taken for this visit.There is no height or weight on file to calculate BMI.  General Appearance: NA  Eye Contact:  NA  Speech:  Clear and Coherent  Volume:  Normal  Mood:  Anxious  Affect:  NA  Thought Process:  Goal Directed  Orientation:  Full (Time, Place, and Person)  Thought Content: Rumination   Suicidal Thoughts:  No  Homicidal Thoughts:  No  Memory:  Immediate;   Good Recent;   Good Remote;   Fair  Judgement:  Fair  Insight:  Shallow  Psychomotor Activity:  Normal  Concentration:  Concentration: Good and Attention Span: Good  Recall:  Good  Fund of Knowledge: Fair  Language: Good  Akathisia:  No  Handed:  Right  AIMS (if indicated): not done  Assets:  Communication Skills Desire for Improvement Physical Health Resilience Social Support Talents/Skills  ADL's:  Intact  Cognition: WNL  Sleep:  Good   Screenings:   Assessment and Plan: This patient is a 53 year old female with a history of  depression and mood swings.  She is doing overall better in terms of depression and mood swings but she still very anxious.  Therefore we will add lorazepam 0.5 mg daily as needed for anxiety.  She will continue Celexa 40 mg daily for depression and Lamictal 25 mg twice daily for mood stabilization.  She will return to see me in 6 weeks or call sooner if needed   Levonne Spiller, MD 03/02/2019, 3:41 PM

## 2019-04-13 ENCOUNTER — Ambulatory Visit (HOSPITAL_COMMUNITY): Payer: BC Managed Care – PPO | Admitting: Psychiatry

## 2019-04-28 ENCOUNTER — Encounter (HOSPITAL_COMMUNITY): Payer: Self-pay | Admitting: Psychiatry

## 2019-04-28 ENCOUNTER — Other Ambulatory Visit: Payer: Self-pay

## 2019-04-28 ENCOUNTER — Ambulatory Visit (INDEPENDENT_AMBULATORY_CARE_PROVIDER_SITE_OTHER): Payer: BC Managed Care – PPO | Admitting: Psychiatry

## 2019-04-28 DIAGNOSIS — F321 Major depressive disorder, single episode, moderate: Secondary | ICD-10-CM

## 2019-04-28 MED ORDER — CITALOPRAM HYDROBROMIDE 40 MG PO TABS: 40.0000 mg | ORAL_TABLET | Freq: Every day | ORAL | 2 refills | Status: DC

## 2019-04-28 MED ORDER — LAMOTRIGINE 25 MG PO TABS: 25.0000 mg | ORAL_TABLET | Freq: Two times a day (BID) | ORAL | 2 refills | Status: DC

## 2019-04-28 NOTE — Progress Notes (Signed)
Virtual Visit via Telephone Note  I connected with JAREN KEARN on 04/28/19 at  4:00 PM EST by telephone and verified that I am speaking with the correct person using two identifiers.   I discussed the limitations, risks, security and privacy concerns of performing an evaluation and management service by telephone and the availability of in person appointments. I also discussed with the patient that there may be a patient responsible charge related to this service. The patient expressed understanding and agreed to proceed.    I discussed the assessment and treatment plan with the patient. The patient was provided an opportunity to ask questions and all were answered. The patient agreed with the plan and demonstrated an understanding of the instructions.   The patient was advised to call back or seek an in-person evaluation if the symptoms worsen or if the condition fails to improve as anticipated.  I provided 15 minutes of non-face-to-face time during this encounter.   Diannia Ruder, MD  [facility] MD/PA/NP OP Progress Note  04/28/2019 4:11 PM ELIE GRAGERT  MRN:  [id number]  Chief Complaint:  Chief Complaint    Depression; Anxiety; Follow-up     HPI: This patient is a 53 year old divorced white female who lives alone in [place].  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  The patient returns after 2 months.  Last time she was having a lot of difficulty with anxiety particularly at work.  I added some Ativan to her regimen but it made her very drowsy and she had to stop it.  However she states that the Celexa and Lamictal have finally "kicked in."  She is feeling a lot better.  She is able to go to work without anxiety and fear.  She is talking to more people and feeling much less stressed in social situations.  She is sleeping well at night.  Her work is currently very busy because of holiday online shopping and she is even getting overtime pay. Visit Diagnosis:   ICD-10-CM   1. Moderate single current episode of major depressive disorder (HCC)  F32.1     Past Psychiatric History: Prior psychiatric hospitalization in 2007  Past Medical History:  Past Medical History:  Diagnosis Date  . Depression   . Headache(784.0)   . Hearing impairment   . History of anemia   . Hypertension   . Irritable bowel syndrome   . Missed period 08/19/2013  . Obesity   . Thyroid disease    History reviewed. No pertinent surgical history.  Family Psychiatric History: see below  Family History:  Family History  Problem Relation Age of Onset  . Depression Sister   . Depression Brother   . Depression Brother   . Alcohol abuse Paternal Uncle   . Cirrhosis Paternal Uncle   . Depression Maternal Grandmother        "Nervous Breakdown"  . Seizures Father   . ADD / ADHD Daughter   . ADD / ADHD Son   . Cancer Maternal Grandfather        colon  . Drug abuse Neg Hx   . Dementia Neg Hx   . Bipolar disorder Neg Hx   . Anxiety disorder Neg Hx   . OCD Neg Hx   . Paranoid behavior Neg Hx   . Schizophrenia Neg Hx   . Sexual abuse Neg Hx   . Physical abuse Neg Hx     Social History:  Social History   Socioeconomic History  .  Marital status: Divorced    Spouse name: Not on file  . Number of children: Not on file  . Years of education: Not on file  . Highest education level: Not on file  Occupational History  . Not on file  Social Needs  . Financial resource strain: Not on file  . Food insecurity    Worry: Not on file    Inability: Not on file  . Transportation needs    Medical: Not on file    Non-medical: Not on file  Tobacco Use  . Smoking status: Current Every Day Smoker    Packs/day: 0.50    Years: 9.00    Pack years: 4.50    Types: Cigarettes  . Smokeless tobacco: Never Used  Substance and Sexual Activity  . Alcohol use: No  . Drug use: No  . Sexual activity: Yes    Birth control/protection: None  Lifestyle  . Physical activity    Days  per week: Not on file    Minutes per session: Not on file  . Stress: Not on file  Relationships  . Social Herbalist on phone: Not on file    Gets together: Not on file    Attends religious service: Not on file    Active member of club or organization: Not on file    Attends meetings of clubs or organizations: Not on file    Relationship status: Not on file  Other Topics Concern  . Not on file  Social History Narrative  . Not on file    Allergies:  Allergies  Allergen Reactions  . Bupropion Other (See Comments)    Depression  . Losartan Other (See Comments)    Jitteriness   . Metoprolol Nausea And Vomiting  . Naproxen Sodium Other (See Comments)    Funny feeling  . Sulfa Antibiotics Other (See Comments)    Unknown.   . Prednisone Other (See Comments) and Palpitations    Shakiness    Metabolic Disorder Labs: No results found for: HGBA1C, MPG No results found for: PROLACTIN Lab Results  Component Value Date   CHOL 156 08/19/2013   TRIG 196 (H) 08/19/2013   HDL 26 (L) 08/19/2013   CHOLHDL 6.0 08/19/2013   VLDL 39 08/19/2013   LDLCALC 91 08/19/2013   Lab Results  Component Value Date   TSH 2.668 08/19/2013    Therapeutic Level Labs: No results found for: LITHIUM No results found for: VALPROATE No components found for:  CBMZ  Current Medications: Current Outpatient Medications  Medication Sig Dispense Refill  . citalopram (CELEXA) 40 MG tablet Take 1 tablet (40 mg total) by mouth daily. 30 tablet 2  . lamoTRIgine (LAMICTAL) 25 MG tablet Take 1 tablet (25 mg total) by mouth 2 (two) times daily. 60 tablet 2  . phenylephrine (SUDAFED PE) 10 MG TABS tablet Take 10 mg by mouth every 4 (four) hours as needed (decongestion).     No current facility-administered medications for this visit.      Musculoskeletal: Strength & Muscle Tone: within normal limits Gait & Station: normal Patient leans: N/A  Psychiatric Specialty Exam: Review of Systems  All  other systems reviewed and are negative.   There were no vitals taken for this visit.There is no height or weight on file to calculate BMI.  General Appearance: NA  Eye Contact:  NA  Speech:  Clear and Coherent  Volume:  Normal  Mood:  Euthymic  Affect:  NA  Thought Process:  Goal Directed  Orientation:  Full (Time, Place, and Person)  Thought Content: WDL   Suicidal Thoughts:  No  Homicidal Thoughts:  No  Memory:  Immediate;   Good Recent;   Good Remote;   Fair  Judgement:  Good  Insight:  Good  Psychomotor Activity:  Normal  Concentration:  Concentration: Good and Attention Span: Good  Recall:  Good  Fund of Knowledge: Good  Language: Good  Akathisia:  No  Handed:  Right  AIMS (if indicated): not done  Assets:  Communication Skills Desire for Improvement Physical Health Resilience Social Support Talents/Skills  ADL's:  Intact  Cognition: WNL  Sleep:  Good   Screenings:   Assessment and Plan: This patient is a 53 year old female with a history of depression and mood swings.  She is doing much better on her current regimen.  She will continue Celexa 40 mg daily for depression and Lamictal 25 mg twice daily for mood stabilization.  She will return to see me in 3 months   Diannia Rudereborah Jahon Bart, MD 04/28/2019, 4:11 PM

## 2019-07-30 ENCOUNTER — Encounter (HOSPITAL_COMMUNITY): Payer: Self-pay | Admitting: Psychiatry

## 2019-07-30 ENCOUNTER — Ambulatory Visit (INDEPENDENT_AMBULATORY_CARE_PROVIDER_SITE_OTHER): Payer: BC Managed Care – PPO | Admitting: Psychiatry

## 2019-07-30 ENCOUNTER — Other Ambulatory Visit: Payer: Self-pay

## 2019-07-30 DIAGNOSIS — F321 Major depressive disorder, single episode, moderate: Secondary | ICD-10-CM | POA: Diagnosis not present

## 2019-07-30 MED ORDER — LAMOTRIGINE 25 MG PO TABS: 25.0000 mg | ORAL_TABLET | Freq: Two times a day (BID) | ORAL | 2 refills | Status: DC

## 2019-07-30 MED ORDER — CITALOPRAM HYDROBROMIDE 40 MG PO TABS: 40.0000 mg | ORAL_TABLET | Freq: Every day | ORAL | 2 refills | Status: DC

## 2019-07-30 NOTE — Progress Notes (Signed)
Virtual Visit via Telephone Note  I connected with Joanne Frazier on 07/30/19 at  4:00 PM EST by telephone and verified that I am speaking with the correct person using two identifiers.   I discussed the limitations, risks, security and privacy concerns of performing an evaluation and management service by telephone and the availability of in person appointments. I also discussed with the patient that there may be a patient responsible charge related to this service. The patient expressed understanding and agreed to proceed       I discussed the assessment and treatment plan with the patient. The patient was provided an opportunity to ask questions and all were answered. The patient agreed with the plan and demonstrated an understanding of the instructions.   The patient was advised to call back or seek an in-person evaluation if the symptoms worsen or if the condition fails to improve as anticipated.  I provided 15 minutes of non-face-to-face time during this encounter.   Joanne Ruder, MD  [facility] MD/PA/NP OP Progress Note  07/30/2019 4:16 PM Joanne Frazier  MRN:  [id number]  Chief Complaint:  Chief Complaint    Depression; Anxiety; Follow-up     HPI: This patient is a 54 year old divorced white female who lives alone and roughened.  She works at [employer] in the [employer].  The patient returns after 3 months.  She states that she is doing quite well now.  She thinks that the medications are working well for her and she no longer has symptoms of anxiety or depression.  She denies any current panic attacks.  She is sleeping well at night.  She is talking to more coworkers and making friends.  She is doing a lot of walking at her job which has been good for her physical health.  She states that her mood is good and she denies any depressive symptoms thoughts of suicide or self-harm Visit Diagnosis:    ICD-10-CM   1. Moderate single current episode of major depressive disorder  (HCC)  F32.1     Past Psychiatric History: 1 psychiatric hospitalization in 2007  Past Medical History:  Past Medical History:  Diagnosis Date  . Depression   . Headache(784.0)   . Hearing impairment   . History of anemia   . Hypertension   . Irritable bowel syndrome   . Missed period 08/19/2013  . Obesity   . Thyroid disease    History reviewed. No pertinent surgical history.  Family Psychiatric History:see below  Family History:  Family History  Problem Relation Age of Onset  . Depression Sister   . Depression Brother   . Depression Brother   . Alcohol abuse Paternal Uncle   . Cirrhosis Paternal Uncle   . Depression Maternal Grandmother        "Nervous Breakdown"  . Seizures Father   . ADD / ADHD Daughter   . ADD / ADHD Son   . Cancer Maternal Grandfather        colon  . Drug abuse Neg Hx   . Dementia Neg Hx   . Bipolar disorder Neg Hx   . Anxiety disorder Neg Hx   . OCD Neg Hx   . Paranoid behavior Neg Hx   . Schizophrenia Neg Hx   . Sexual abuse Neg Hx   . Physical abuse Neg Hx     Social History:  Social History   Socioeconomic History  . Marital status: Divorced    Spouse name: Not on file  .  Number of children: Not on file  . Years of education: Not on file  . Highest education level: Not on file  Occupational History  . Not on file  Tobacco Use  . Smoking status: Current Every Day Smoker    Packs/day: 0.50    Years: 9.00    Pack years: 4.50    Types: Cigarettes  . Smokeless tobacco: Never Used  Substance and Sexual Activity  . Alcohol use: No  . Drug use: No  . Sexual activity: Yes    Birth control/protection: None  Other Topics Concern  . Not on file  Social History Narrative  . Not on file   Social Determinants of Health   Financial Resource Strain:   . Difficulty of Paying Living Expenses: Not on file  Food Insecurity:   . Worried About Programme researcher, broadcasting/film/video in the Last Year: Not on file  . Ran Out of Food in the Last Year: Not  on file  Transportation Needs:   . Lack of Transportation (Medical): Not on file  . Lack of Transportation (Non-Medical): Not on file  Physical Activity:   . Days of Exercise per Week: Not on file  . Minutes of Exercise per Session: Not on file  Stress:   . Feeling of Stress : Not on file  Social Connections:   . Frequency of Communication with Friends and Family: Not on file  . Frequency of Social Gatherings with Friends and Family: Not on file  . Attends Religious Services: Not on file  . Active Member of Clubs or Organizations: Not on file  . Attends Banker Meetings: Not on file  . Marital Status: Not on file    Allergies:  Allergies  Allergen Reactions  . Bupropion Other (See Comments)    Depression  . Losartan Other (See Comments)    Jitteriness   . Metoprolol Nausea And Vomiting  . Naproxen Sodium Other (See Comments)    Funny feeling  . Sulfa Antibiotics Other (See Comments)    Unknown.   . Prednisone Other (See Comments) and Palpitations    Shakiness    Metabolic Disorder Labs: No results found for: HGBA1C, MPG No results found for: PROLACTIN Lab Results  Component Value Date   CHOL 156 08/19/2013   TRIG 196 (H) 08/19/2013   HDL 26 (L) 08/19/2013   CHOLHDL 6.0 08/19/2013   VLDL 39 08/19/2013   LDLCALC 91 08/19/2013   Lab Results  Component Value Date   TSH 2.668 08/19/2013    Therapeutic Level Labs: No results found for: LITHIUM No results found for: VALPROATE No components found for:  CBMZ  Current Medications: Current Outpatient Medications  Medication Sig Dispense Refill  . citalopram (CELEXA) 40 MG tablet Take 1 tablet (40 mg total) by mouth daily. 90 tablet 2  . lamoTRIgine (LAMICTAL) 25 MG tablet Take 1 tablet (25 mg total) by mouth 2 (two) times daily. 180 tablet 2  . phenylephrine (SUDAFED PE) 10 MG TABS tablet Take 10 mg by mouth every 4 (four) hours as needed (decongestion).     No current facility-administered medications  for this visit.     Musculoskeletal: Strength & Muscle Tone: within normal limits Gait & Station: normal Patient leans: N/A  Psychiatric Specialty Exam: Review of Systems  All other systems reviewed and are negative.   There were no vitals taken for this visit.There is no height or weight on file to calculate BMI.  General Appearance: NA  Eye Contact:  NA  Speech:  Clear and Coherent  Volume:  Normal  Mood:  Euthymic  Affect:  NA  Thought Process:  Goal Directed  Orientation:  Full (Time, Place, and Person)  Thought Content: WDL   Suicidal Thoughts:  No  Homicidal Thoughts:  No  Memory:  Immediate;   Good Recent;   Good Remote;   Good  Judgement:  Good  Insight:  Fair  Psychomotor Activity:  Normal  Concentration:  Concentration: Good and Attention Span: Good  Recall:  Good  Fund of Knowledge: Good  Language: Good  Akathisia:  No  Handed:  Right  AIMS (if indicated): not done  Assets:  Communication Skills Desire for Improvement Physical Health Resilience Social Support Talents/Skills  ADL's:  Intact  Cognition: WNL  Sleep:  Good   Screenings:   Assessment and Plan: Patient is a 54 year old female with a history of depression and mood swings.  She continues to do quite well on her current regimen.  She will continue Celexa 40 mg daily for depression and Lamictal 25 mg twice daily for mood stabilization.  She will return to see me in 3 months   Levonne Spiller, MD 07/30/2019, 4:16 PM

## 2019-09-07 ENCOUNTER — Other Ambulatory Visit: Payer: Self-pay

## 2019-09-07 ENCOUNTER — Ambulatory Visit: Payer: BC Managed Care – PPO | Attending: Internal Medicine

## 2019-09-07 DIAGNOSIS — Z20822 Contact with and (suspected) exposure to covid-19: Secondary | ICD-10-CM

## 2019-09-08 LAB — SARS-COV-2, NAA 2 DAY TAT

## 2019-09-08 LAB — NOVEL CORONAVIRUS, NAA: SARS-CoV-2, NAA: NOT DETECTED

## 2019-09-24 ENCOUNTER — Ambulatory Visit (INDEPENDENT_AMBULATORY_CARE_PROVIDER_SITE_OTHER): Payer: BC Managed Care – PPO | Admitting: Psychiatry

## 2019-09-24 ENCOUNTER — Other Ambulatory Visit (HOSPITAL_COMMUNITY): Payer: Self-pay | Admitting: Psychiatry

## 2019-09-24 ENCOUNTER — Other Ambulatory Visit: Payer: Self-pay

## 2019-09-24 ENCOUNTER — Encounter (HOSPITAL_COMMUNITY): Payer: Self-pay | Admitting: Psychiatry

## 2019-09-24 DIAGNOSIS — F321 Major depressive disorder, single episode, moderate: Secondary | ICD-10-CM

## 2019-09-24 MED ORDER — LAMOTRIGINE 25 MG PO TABS: 50.0000 mg | ORAL_TABLET | Freq: Two times a day (BID) | ORAL | 2 refills | Status: DC

## 2019-09-24 MED ORDER — CITALOPRAM HYDROBROMIDE 40 MG PO TABS: 40.0000 mg | ORAL_TABLET | Freq: Every day | ORAL | 2 refills | Status: DC

## 2019-09-24 NOTE — Progress Notes (Signed)
Virtual Visit via Telephone Note  I connected with AKEYA RYTHER on 09/24/19 at  8:00 AM EDT by telephone and verified that I am speaking with the correct person using two identifiers.   I discussed the limitations, risks, security and privacy concerns of performing an evaluation and management service by telephone and the availability of in person appointments. I also discussed with the patient that there may be a patient responsible charge related to this service. The patient expressed understanding and agreed to proceed.     I discussed the assessment and treatment plan with the patient. The patient was provided an opportunity to ask questions and all were answered. The patient agreed with the plan and demonstrated an understanding of the instructions.   The patient was advised to call back or seek an in-person evaluation if the symptoms worsen or if the condition fails to improve as anticipated.  I provided 15 minutes of non-face-to-face time during this encounter.   Diannia Ruder, MD  [facility] MD/PA/NP OP Progress Note  09/24/2019 4:58 PM SORIYAH OSBERG  MRN:  [id number]  Chief Complaint:  Chief Complaint    Depression; Anxiety; Follow-up     HPI: This patient is a 54 year old divorced white female who lives alone in [place].  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  The patient returns after approximately 2 months as a work in today.  Her company had sent [place] forms to fill out for her missing time from work from April 6-16, 2021.  She had not let me know about this ahead of time but now wants to return to work excuse.  She explained that she had become quite depressed again.  She did not feel like facing people and this is happened before.  She states that she has been med compliant.  She denies being suicidal but does not "feel happy."  Lately her energy has been low she does not feel like doing much of anything and cannot motivate herself to even clean her house or wash her  dishes.  However she realizes she needs to try to go back to work and wants to return on 09/26/2019.  The patient relates to recent stressors such as conflicts with both of her adult children and feeling like they do not like her and ignore her.  She does not really have much else going on in her life other than her job and I urged her to get into therapy to work on improving her repertoire of interest.  She states that she will contact the [facility] at [facility] to start therapy.  Since she is already on a high dose of Celexa we will try to increase the Lamictal to see if this will help somewhat.  She denies any thoughts right now of self-harm or suicide. Visit Diagnosis:    ICD-10-CM   1. Moderate single current episode of major depressive disorder (HCC)  F32.1     Past Psychiatric History: 1 psychiatric hospitalization in 2007  Past Medical History:  Past Medical History:  Diagnosis Date  . Depression   . Headache(784.0)   . Hearing impairment   . History of anemia   . Hypertension   . Irritable bowel syndrome   . Missed period 08/19/2013  . Obesity   . Thyroid disease    History reviewed. No pertinent surgical history.  Family Psychiatric History: see below  Family History:  Family History  Problem Relation Age of Onset  . Depression Sister   .  Depression Brother   . Depression Brother   . Alcohol abuse Paternal Uncle   . Cirrhosis Paternal Uncle   . Depression Maternal Grandmother        "Nervous Breakdown"  . Seizures Father   . ADD / ADHD Daughter   . ADD / ADHD Son   . Cancer Maternal Grandfather        colon  . Drug abuse Neg Hx   . Dementia Neg Hx   . Bipolar disorder Neg Hx   . Anxiety disorder Neg Hx   . OCD Neg Hx   . Paranoid behavior Neg Hx   . Schizophrenia Neg Hx   . Sexual abuse Neg Hx   . Physical abuse Neg Hx     Social History:  Social History   Socioeconomic History  . Marital status: Divorced    Spouse name: Not on file  . Number of children: Not  on file  . Years of education: Not on file  . Highest education level: Not on file  Occupational History  . Not on file  Tobacco Use  . Smoking status: Current Every Day Smoker    Packs/day: 0.50    Years: 9.00    Pack years: 4.50    Types: Cigarettes  . Smokeless tobacco: Never Used  Substance and Sexual Activity  . Alcohol use: No  . Drug use: No  . Sexual activity: Yes    Birth control/protection: None  Other Topics Concern  . Not on file  Social History Narrative  . Not on file   Social Determinants of Health   Financial Resource Strain:   . Difficulty of Paying Living Expenses:   Food Insecurity:   . Worried About Programme researcher, broadcasting/film/video in the Last Year:   . Barista in the Last Year:   Transportation Needs:   . Freight forwarder (Medical):   Marland Kitchen Lack of Transportation (Non-Medical):   Physical Activity:   . Days of Exercise per Week:   . Minutes of Exercise per Session:   Stress:   . Feeling of Stress :   Social Connections:   . Frequency of Communication with Friends and Family:   . Frequency of Social Gatherings with Friends and Family:   . Attends Religious Services:   . Active Member of Clubs or Organizations:   . Attends Banker Meetings:   Marland Kitchen Marital Status:     Allergies:  Allergies  Allergen Reactions  . Bupropion Other (See Comments)    Depression  . Losartan Other (See Comments)    Jitteriness   . Metoprolol Nausea And Vomiting  . Naproxen Sodium Other (See Comments)    Funny feeling  . Sulfa Antibiotics Other (See Comments)    Unknown.   . Prednisone Other (See Comments) and Palpitations    Shakiness    Metabolic Disorder Labs: No results found for: HGBA1C, MPG No results found for: PROLACTIN Lab Results  Component Value Date   CHOL 156 08/19/2013   TRIG 196 (H) 08/19/2013   HDL 26 (L) 08/19/2013   CHOLHDL 6.0 08/19/2013   VLDL 39 08/19/2013   LDLCALC 91 08/19/2013   Lab Results  Component Value Date   TSH  2.668 08/19/2013    Therapeutic Level Labs: No results found for: LITHIUM No results found for: VALPROATE No components found for:  CBMZ  Current Medications: Current Outpatient Medications  Medication Sig Dispense Refill  . citalopram (CELEXA) 40 MG tablet Take 1 tablet (  40 mg total) by mouth daily. 90 tablet 2  . lamoTRIgine (LAMICTAL) 25 MG tablet Take 2 tablets (50 mg total) by mouth 2 (two) times daily. 120 tablet 2   No current facility-administered medications for this visit.     Musculoskeletal: Strength & Muscle Tone: within normal limits Gait & Station: normal Patient leans: N/A  Psychiatric Specialty Exam: Review of Systems  Constitutional: Positive for fatigue.  Psychiatric/Behavioral: Positive for dysphoric mood. The patient is nervous/anxious.   All other systems reviewed and are negative.   There were no vitals taken for this visit.There is no height or weight on file to calculate BMI.  General Appearance: NA  Eye Contact:  NA  Speech:  Clear and Coherent  Volume:  Normal  Mood:  Anxious and Dysphoric  Affect:  NA  Thought Process:  Goal Directed  Orientation:  Full (Time, Place, and Person)  Thought Content: Rumination   Suicidal Thoughts:  No  Homicidal Thoughts:  No  Memory:  Immediate;   Good Recent;   Good Remote;   Good  Judgement:  Fair  Insight:  Shallow  Psychomotor Activity:  Decreased  Concentration:  Concentration: Fair and Attention Span: Fair  Recall:  Good  Fund of Knowledge: Good  Language: Good  Akathisia:  No  Handed:  Right  AIMS (if indicated): not done  Assets:  Communication Skills Desire for Improvement Physical Health Resilience Social Support Talents/Skills  ADL's:  Intact  Cognition: WNL  Sleep:  Good   Screenings:   Assessment and Plan: This patient is a 54 year old female with a history of depression and mood swings.  She has become more depressed recently probably because of family conflicts.  When this  happens she tends to shut down and refrain from going out of the house.  I urged her not to do this and to get into therapy to help her deal with the conflicts better and she agrees.  She does feel like she can return back to work on 09/26/2019 without significant difficulty so I will allow her to do so.  She will continue Celexa 40 mg daily for depression Lamictal will be increased to 50 mg twice daily for mood stabilization.  She will return to see me in 2 weeks   Levonne Spiller, MD 09/24/2019, 4:58 PM

## 2019-11-12 ENCOUNTER — Other Ambulatory Visit: Payer: Self-pay

## 2019-11-12 ENCOUNTER — Encounter (HOSPITAL_COMMUNITY): Payer: Self-pay | Admitting: Emergency Medicine

## 2019-11-12 ENCOUNTER — Emergency Department (HOSPITAL_COMMUNITY)
Admission: EM | Admit: 2019-11-12 | Discharge: 2019-11-12 | Disposition: A | Discharge status: Other Acute Inpatient Hospital | Payer: Self-pay | Attending: Emergency Medicine | Admitting: Emergency Medicine

## 2019-11-12 DIAGNOSIS — I10 Essential (primary) hypertension: Secondary | ICD-10-CM | POA: Insufficient documentation

## 2019-11-12 DIAGNOSIS — F17210 Nicotine dependence, cigarettes, uncomplicated: Secondary | ICD-10-CM | POA: Insufficient documentation

## 2019-11-12 DIAGNOSIS — T1491XA Suicide attempt, initial encounter: Secondary | ICD-10-CM

## 2019-11-12 DIAGNOSIS — Z20822 Contact with and (suspected) exposure to covid-19: Secondary | ICD-10-CM | POA: Insufficient documentation

## 2019-11-12 DIAGNOSIS — F332 Major depressive disorder, recurrent severe without psychotic features: Secondary | ICD-10-CM | POA: Insufficient documentation

## 2019-11-12 DIAGNOSIS — T50902A Poisoning by unspecified drugs, medicaments and biological substances, intentional self-harm, initial encounter: Secondary | ICD-10-CM

## 2019-11-12 LAB — COMPREHENSIVE METABOLIC PANEL
ALT: 20 U/L (ref 0–44)
AST: 20 U/L (ref 15–41)
Albumin: 4 g/dL (ref 3.5–5.0)
Alkaline Phosphatase: 116 U/L (ref 38–126)
Anion gap: 10 (ref 5–15)
BUN: 13 mg/dL (ref 6–20)
CO2: 28 mmol/L (ref 22–32)
Calcium: 8.8 mg/dL — ABNORMAL LOW (ref 8.9–10.3)
Chloride: 103 mmol/L (ref 98–111)
Creatinine, Ser: 0.8 mg/dL (ref 0.44–1.00)
GFR calc Af Amer: >60 mL/min (ref >60)
GFR calc non Af Amer: >60 mL/min (ref >60)
Glucose, Bld: 125 mg/dL — ABNORMAL HIGH (ref 70–99)
Potassium: 3.9 mmol/L (ref 3.5–5.1)
Sodium: 141 mmol/L (ref 135–145)
Total Bilirubin: 0.4 mg/dL (ref 0.3–1.2)
Total Protein: 7.7 g/dL (ref 6.5–8.1)

## 2019-11-12 LAB — CBC
HCT: 39.2 % (ref 36.0–46.0)
Hemoglobin: 12.8 g/dL (ref 12.0–15.0)
MCH: 31.4 pg (ref 26.0–34.0)
MCHC: 32.7 g/dL (ref 30.0–36.0)
MCV: 96.1 fL (ref 80.0–100.0)
Platelets: 187 10*3/uL (ref 150–400)
RBC: 4.08 MIL/uL (ref 3.87–5.11)
RDW: 12.6 % (ref 11.5–15.5)
WBC: 9.4 10*3/uL (ref 4.0–10.5)
nRBC: 0 % (ref 0.0–0.2)

## 2019-11-12 LAB — ETHANOL: Alcohol, Ethyl (B): <10 mg/dL (ref <10)

## 2019-11-12 LAB — RAPID URINE DRUG SCREEN, HOSP PERFORMED
Amphetamines: NOT DETECTED
Barbiturates: NOT DETECTED
Benzodiazepines: NOT DETECTED
Cocaine: NOT DETECTED
Opiates: NOT DETECTED
Tetrahydrocannabinol: NOT DETECTED

## 2019-11-12 LAB — SARS CORONAVIRUS 2 BY RT PCR (HOSPITAL ORDER, PERFORMED IN ~~LOC~~ HOSPITAL LAB): SARS Coronavirus 2: NEGATIVE

## 2019-11-12 LAB — ACETAMINOPHEN LEVEL
Acetaminophen (Tylenol), Serum: <10 ug/mL — ABNORMAL LOW (ref 10–30)
Acetaminophen (Tylenol), Serum: <10 ug/mL — ABNORMAL LOW (ref 10–30)

## 2019-11-12 LAB — HCG, SERUM, QUALITATIVE: Preg, Serum: NEGATIVE

## 2019-11-12 LAB — SALICYLATE LEVEL: Salicylate Lvl: <7 mg/dL — ABNORMAL LOW (ref 7.0–30.0)

## 2019-11-12 LAB — CBG MONITORING, ED: Glucose-Capillary: 129 mg/dL — ABNORMAL HIGH (ref 70–99)

## 2019-11-12 MED ORDER — ACETAMINOPHEN 500 MG PO TABS
1000.0000 mg | ORAL_TABLET | Freq: Once | ORAL | Status: AC
  Administered 2019-11-12: 1000 mg via ORAL
  Filled 2019-11-12: qty 2

## 2019-11-12 NOTE — Discharge Instructions (Addendum)
Go to [facility] now

## 2019-11-12 NOTE — ED Notes (Addendum)
Spoke with Almira Coaster at [facility]. Poison control would like a repeat EKG and follow up around 9 a.m to medically clear the patient. [phone] Gina RN day time contact.

## 2019-11-12 NOTE — ED Notes (Signed)
ED TO INPATIENT HANDOFF REPORT  ED Nurse Name and Phone #: [phone]  S Name/Age/Gender Joanne Frazier 54 y.o. female Room/Bed: APA17/APA17  Code Status   Code Status: Full Code  Home/SNF/Other Home Patient oriented to: self, place, time and situation Is this baseline? Yes   Triage Complete: Triage complete  Chief Complaint overdose  Triage Note Pt reports taking "my lamictal before getting in the shower and after getting out of the shower." Pt stating "I just turned the bottle up." Pt reports this was an attempt to kill herself.     Allergies Allergies  Allergen Reactions  . Bupropion Other (See Comments)    Depression  . Losartan Other (See Comments)    Jitteriness   . Metoprolol Nausea And Vomiting  . Naproxen Sodium Other (See Comments)    Funny feeling  . Sulfa Antibiotics Other (See Comments)    Unknown.   . Prednisone Other (See Comments) and Palpitations    Shakiness    Level of Care/Admitting Diagnosis ED Disposition    ED Disposition Condition Comment   Transfer to Another Facility  The patient appears reasonably stabilized for transfer considering the current resources, flow, and capabilities available in the ED at this time, and I doubt any other [facility] requiring further screening and/or treatment in the ED prior to transfer is p resent. [facility].  Dr. Gordy Levan accepting      B Medical/Surgery History Past Medical History:  Diagnosis Date  . Depression   . Headache(784.0)   . Hearing impairment   . History of anemia   . Hypertension   . Irritable bowel syndrome   . Missed period 08/19/2013  . Obesity   . Thyroid disease    History reviewed. No pertinent surgical history.   A IV Location/Drains/Wounds Patient Lines/Drains/Airways Status   Active Line/Drains/Airways    None          Intake/Output Last 24 hours No intake or output data in the 24 hours ending 11/12/19 1612  Labs/Imaging Results for orders placed  or performed during the hospital encounter of 11/12/19 (from the past 48 hour(s))  Rapid urine drug screen (hospital performed)     Status: None   Collection Time: 11/12/19  2:24 AM  Result Value Ref Range   Opiates NONE DETECTED NONE DETECTED   Cocaine NONE DETECTED NONE DETECTED   Benzodiazepines NONE DETECTED NONE DETECTED   Amphetamines NONE DETECTED NONE DETECTED   Tetrahydrocannabinol NONE DETECTED NONE DETECTED   Barbiturates NONE DETECTED NONE DETECTED    Comment: (NOTE) DRUG SCREEN FOR MEDICAL PURPOSES ONLY.  IF CONFIRMATION IS NEEDED FOR ANY PURPOSE, NOTIFY LAB WITHIN 5 DAYS. LOWEST DETECTABLE LIMITS FOR URINE DRUG SCREEN Drug Class                     Cutoff (ng/mL) Amphetamine and metabolites    1000 Barbiturate and metabolites    200 Benzodiazepine                 200 Tricyclics and metabolites     300 Opiates and metabolites        300 Cocaine and metabolites        300 THC                            50 Performed at [facility], [address] [place], [place] [address]   CBG monitoring, ED     Status:  Abnormal   Collection Time: 11/12/19  2:33 AM  Result Value Ref Range   Glucose-Capillary 129 (H) 70 - 99 mg/dL    Comment: Glucose reference range applies only to samples taken after fasting for at least 8 hours.  Comprehensive metabolic panel     Status: Abnormal   Collection Time: 11/12/19  2:34 AM  Result Value Ref Range   Sodium 141 135 - 145 mmol/L   Potassium 3.9 3.5 - 5.1 mmol/L   Chloride 103 98 - 111 mmol/L   CO2 28 22 - 32 mmol/L   Glucose, Bld 125 (H) 70 - 99 mg/dL    Comment: Glucose reference range applies only to samples taken after fasting for at least 8 hours.   BUN 13 6 - 20 mg/dL   Creatinine, Ser 0.80 0.44 - 1.00 mg/dL   Calcium 8.8 (L) 8.9 - 10.3 mg/dL   Total Protein 7.7 6.5 - 8.1 g/dL   Albumin 4.0 3.5 - 5.0 g/dL   AST 20 15 - 41 U/L   ALT 20 0 - 44 U/L   Alkaline Phosphatase 116 38 - 126 U/L   Total Bilirubin 0.4 0.3 - 1.2 mg/dL    GFR calc non Af Amer >60 >60 mL/min   GFR calc Af Amer >60 >60 mL/min   Anion gap 10 5 - 15    Comment: Performed at [facility], [address], [place], [place] [address]  Ethanol     Status: None   Collection Time: 11/12/19  2:34 AM  Result Value Ref Range   Alcohol, Ethyl (B) <10 <10 mg/dL    Comment: (NOTE) Lowest detectable limit for serum alcohol is 10 mg/dL. For medical purposes only. Performed at [facility], [address] [place], [place] [address]   Salicylate level     Status: Abnormal   Collection Time: 11/12/19  2:34 AM  Result Value Ref Range   Salicylate Lvl <8.6 (L) 7.0 - 30.0 mg/dL    Comment: Performed at [facility], [address], [place], [place] [address]  Acetaminophen level     Status: Abnormal   Collection Time: 11/12/19  2:34 AM  Result Value Ref Range   Acetaminophen (Tylenol), Serum <10 (L) 10 - 30 ug/mL    Comment: (NOTE) Therapeutic concentrations vary significantly. A range of 10-30 ug/mL  may be an effective concentration for many patients. However, some  are best treated at concentrations outside of this range. Acetaminophen concentrations >150 ug/mL at 4 hours after ingestion  and >50 ug/mL at 12 hours after ingestion are often associated with  toxic reactions. Performed at [facility], [address], [place], [place] [address]   cbc     Status: None   Collection Time: 11/12/19  2:34 AM  Result Value Ref Range   WBC 9.4 4.0 - 10.5 K/uL   RBC 4.08 3.87 - 5.11 MIL/uL   Hemoglobin 12.8 12.0 - 15.0 g/dL   HCT 39.2 36.0 - 46.0 %   MCV 96.1 80.0 - 100.0 fL   MCH 31.4 26.0 - 34.0 pg   MCHC 32.7 30.0 - 36.0 g/dL   RDW 12.6 11.5 - 15.5 %   Platelets 187 150 - 400 K/uL   nRBC 0.0 0.0 - 0.2 %    Comment: Performed at [facility], [address], [place], [place] [address]  hCG, serum, qualitative     Status: None   Collection Time: 11/12/19  2:34 AM  Result Value Ref Range   Preg, Serum  NEGATIVE NEGATIVE    Comment:        THE SENSITIVITY  OF THIS METHODOLOGY IS >10 mIU/mL. Performed at [facility], [address] [place], [place] [address]   Acetaminophen level     Status: Abnormal   Collection Time: 11/12/19  5:34 AM  Result Value Ref Range   Acetaminophen (Tylenol), Serum <10 (L) 10 - 30 ug/mL    Comment: (NOTE) Therapeutic concentrations vary significantly. A range of 10-30 ug/mL  may be an effective concentration for many patients. However, some  are best treated at concentrations outside of this range. Acetaminophen concentrations >150 ug/mL at 4 hours after ingestion  and >50 ug/mL at 12 hours after ingestion are often associated with  toxic reactions. Performed at [facility], [address] [place], [place] [address]   SARS Coronavirus 2 by RT PCR (hospital order, performed in [facility] hospital lab) Nasopharyngeal Nasopharyngeal Swab     Status: None   Collection Time: 11/12/19  2:09 PM   Specimen: Nasopharyngeal Swab  Result Value Ref Range   SARS Coronavirus 2 NEGATIVE NEGATIVE    Comment: (NOTE) SARS-CoV-2 target nucleic acids are NOT DETECTED. The SARS-CoV-2 RNA is generally detectable in upper and lower respiratory specimens during the acute phase of infection. The lowest concentration of SARS-CoV-2 viral copies this assay can detect is 250 copies / mL. A negative result does not preclude SARS-CoV-2 infection and should not be used as the sole basis for treatment or other patient management decisions.  A negative result may occur with improper specimen collection / handling, submission of specimen other than nasopharyngeal swab, presence of viral mutation(s) within the areas targeted by this assay, and inadequate number of viral copies (<250 copies / mL). A negative result must be combined with clinical observations, patient history, and epidemiological information. Fact Sheet for Patients:   BoilerBrush.com.cy Fact Sheet for Healthcare  Providers: https://pope.com/ This test is not yet approved or cleared  by the [place] FDA and has been authorized for detection and/or diagnosis of SARS-CoV-2 by FDA under an Emergency Use Authorization (EUA).  This EUA will remain in effect (meaning this test can be used) for the duration of the COVID-19 declaration under Section 564(b)(1) of the Act, 21 U.S.C. section 360bbb-3(b)(1), unless the authorization is terminated or revoked sooner. Performed at [facility], [address], [place], [place] [address]    No results found.  Pending Labs Unresulted Labs (From admission, onward)   None      Vitals/Pain Today's Vitals   11/12/19 0900 11/12/19 0930 11/12/19 1000 11/12/19 1556  BP: 117/64 (!) 119/57 105/76 114/61  Pulse: 86 91 88 94  Resp: 16 17 18 18   Temp:    98.6 F (37 C)  TempSrc:      SpO2: 94% 97% 95% 96%  PainSc:        Isolation Precautions No active isolations  Medications Medications  acetaminophen (TYLENOL) tablet 1,000 mg (1,000 mg Oral Given 11/12/19 1234)    Mobility walks     Focused Assessments    R Recommendations: See Admitting Provider Note  Report given to:   Additional Notes:
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
## 2019-11-12 NOTE — ED Notes (Signed)
Tele-monitoring placed in room along with order placed. Tele-monitoring services to call RN back.

## 2019-11-12 NOTE — Progress Notes (Addendum)
Pt accepted to [facility]    Dr. Gordy Levan is the accepting/attending provider    Call report to [phone]  Heather @ [facility] ED notified.     Pt is voluntary and will be transported by [employer]  Pt may arrive to [facility] after testing negative for Covid.   Transportation Services form has been completed and sent to [email or web address]  Wells Guiles, LCSW, LCAS Disposition CSW [facility]/TTS [phone]
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
## 2019-11-12 NOTE — ED Notes (Signed)
Poison Control notified by charge nurse.

## 2019-11-12 NOTE — ED Notes (Signed)
At the request of Poison Control, EKG DONE

## 2019-11-12 NOTE — ED Notes (Signed)
Pt changed into psych scrubs. Cell phone, clothes and purse locked in psych lockers.

## 2019-11-12 NOTE — ED Notes (Signed)
Pt wanded by security after changing into psych scrubs.  

## 2019-11-12 NOTE — ED Notes (Signed)
Spoke with pt's daughter to give her an update on pt; she stated she would be back at 12:00 to visit pt

## 2019-11-12 NOTE — ED Triage Notes (Signed)
Pt reports taking "my lamictal before getting in the shower and after getting out of the shower." Pt stating "I just turned the bottle up." Pt reports this was an attempt to kill herself.

## 2019-11-12 NOTE — ED Provider Notes (Signed)
[facility] EMERGENCY DEPARTMENT Provider Note   CSN: [id number] Arrival date & time: 11/12/19  0207     History Chief Complaint  Patient presents with   Drug Overdose   Level 5 caveat due to psychiatric disorder Joanne Frazier is a 54 y.o. female.  The history is provided by the patient.  Drug Overdose This is a new problem. The current episode started 6 to 12 hours ago. The problem occurs constantly. The problem has not changed since onset.Nothing aggravates the symptoms. Nothing relieves the symptoms.  Patient presents for overdose.  Patient reports taking multiple doses of her home Lamictal.  She reports she "just wants to sleep "and also reported to nursing that she wanted to harm herself.  Denies any alcohol or drug abuse      Past Medical History:  Diagnosis Date   Depression    Headache(784.0)    Hearing impairment    History of anemia    Hypertension    Irritable bowel syndrome    Missed period 08/19/2013   Obesity    Thyroid disease     Patient Active Problem List   Diagnosis Date Noted   Missed period 08/19/2013   Decreased libido 08/18/2012   Depression 08/21/2011    History reviewed. No pertinent surgical history.   OB History    Gravida  3   Para  2   Term      Preterm      AB  1   Living  2     SAB  1   TAB      Ectopic      Multiple      Live Births  2           Family History  Problem Relation Age of Onset   Depression Sister    Depression Brother    Depression Brother    Alcohol abuse Paternal Uncle    Cirrhosis Paternal Uncle    Depression Maternal Grandmother        "Nervous Breakdown"   Seizures Father    ADD / ADHD Daughter    ADD / ADHD Son    Cancer Maternal Grandfather        colon   Drug abuse Neg Hx    Dementia Neg Hx    Bipolar disorder Neg Hx    Anxiety disorder Neg Hx    OCD Neg Hx    Paranoid behavior Neg Hx    Schizophrenia Neg Hx    Sexual abuse Neg Hx     Physical abuse Neg Hx     Social History   Tobacco Use   Smoking status: Current Every Day Smoker    Packs/day: 0.50    Years: 9.00    Pack years: 4.50    Types: Cigarettes   Smokeless tobacco: Never Used  Substance Use Topics   Alcohol use: No   Drug use: No    Home Medications Prior to Admission medications   Medication Sig Start Date End Date Taking? Authorizing Provider  citalopram (CELEXA) 40 MG tablet Take 1 tablet (40 mg total) by mouth daily. 09/24/19 09/23/20  Myrlene Broker, MD  lamoTRIgine (LAMICTAL) 25 MG tablet Take 2 tablets (50 mg total) by mouth 2 (two) times daily. 09/24/19   Myrlene Broker, MD    Allergies    Bupropion, Losartan, Metoprolol, Naproxen sodium, Sulfa antibiotics, and Prednisone  Review of Systems   Review of Systems  Unable to perform ROS: Psychiatric disorder  Physical Exam Updated Vital Signs BP 132/66 (BP Location: Right Arm)    Pulse 86    Temp 99.1 F (37.3 C) (Oral)    Resp (!) 9    SpO2 96%   Physical Exam CONSTITUTIONAL: Disheveled HEAD: Normocephalic/atraumatic EYES: EOMI/PERRL, no nystagmus, patient blinks infrequently ENMT: Mucous membranes moist NECK: supple no meningeal signs SPINE/BACK:entire spine nontender CV: S1/S2 noted, no murmurs/rubs/gallops noted LUNGS: Lungs are clear to auscultation bilaterally, no apparent distress ABDOMEN: soft, nontender NEURO: Pt is awake/alert, moves all extremitiesx4.  No facial droop.  Patient appears mildly confused EXTREMITIES: pulses normal/equal, full ROM SKIN: warm, color normal PSYCH: no abnormalities of mood noted, alert and oriented to situation  ED Results / Procedures / Treatments   Labs (all labs ordered are listed, but only abnormal results are displayed) Labs Reviewed  COMPREHENSIVE METABOLIC PANEL - Abnormal; Notable for the following components:      Result Value   Glucose, Bld 125 (*)    Calcium 8.8 (*)    All other components within normal limits    SALICYLATE LEVEL - Abnormal; Notable for the following components:   Salicylate Lvl <0.2 (*)    All other components within normal limits  ACETAMINOPHEN LEVEL - Abnormal; Notable for the following components:   Acetaminophen (Tylenol), Serum <10 (*)    All other components within normal limits  CBG MONITORING, ED - Abnormal; Notable for the following components:   Glucose-Capillary 129 (*)    All other components within normal limits  SARS CORONAVIRUS 2 BY RT PCR (HOSPITAL ORDER, [place] [facility])  ETHANOL  CBC  HCG, SERUM, QUALITATIVE  RAPID URINE DRUG SCREEN, HOSP PERFORMED    EKG EKG Interpretation  Date/Time:  Thursday November 12 2019 02:23:35 EDT Ventricular Rate:  93 PR Interval:    QRS Duration: 87 QT Interval:  325 QTC Calculation: 405 R Axis:   85 Text Interpretation: Sinus rhythm Low voltage, precordial leads Borderline T abnormalities, anterior leads Interpretation limited secondary to artifact No significant change since last tracing Confirmed by Ripley Fraise [phone]) on 11/12/2019 2:48:07 AM   Radiology No results found.  Procedures Procedures   Medications Ordered in ED Medications - No data to display  ED Course  I have reviewed the triage vital signs and the nursing notes.  Pertinent labs  results that were available during my care of the patient were reviewed by me and considered in my medical decision making (see chart for details).    MDM Rules/Calculators/A&P                     3:45 AM Patient presents after an overdose.  She reports taking large quantity of Lamictal.  Patient is awake and alert but appears mildly confused and hypervigilant.  Labs thus far reassuring.  Poison control recommends monitoring for 8 hours to ensure no cardiac arrhythmias. 5:54 AM Pt stable She is awake/alert No acute issues BP (!) 135/59    Pulse 81    Temp 99.1 F (37.3 C) (Oral)    Resp 18    SpO2 97%   EKG  Interpretation  Date/Time:  Thursday November 12 2019 05:30:56 EDT Ventricular Rate:  86 PR Interval:    QRS Duration: 95 QT Interval:  395 QTC Calculation: 478 R Axis:   90 Text Interpretation: Sinus rhythm Ventricular premature complex Aberrant conduction of SV complex(es) Borderline right axis deviation Low voltage, precordial leads Interpretation limited secondary to artifact Confirmed by Ripley Fraise [phone])  on 11/12/2019 5:52:47 AM       Final Clinical Impression(s) / ED Diagnoses Final diagnoses:  Intentional drug overdose, initial encounter [facility])  Suicide attempt [facility])    Rx / DC Orders ED Discharge Orders    None       Zadie Rhine, MD 11/12/19 [phone]

## 2019-11-12 NOTE — ED Notes (Signed)
[phone]  [facility]. Updated EKG

## 2019-11-12 NOTE — ED Notes (Signed)
Spoke to [place] with poison control regarding this pt. Joanne Frazier recommended cardiac monitoring and observation of patients condition 8 hours post ingestion. Also recommended EKG 4 hour after first EKG was performed. Further, she wanted [place] to continue with our regular BH lab standing orders.

## 2019-11-12 NOTE — ED Notes (Signed)
Pt visiting with daughter. Daughter given update on pt.

## 2019-11-18 ENCOUNTER — Ambulatory Visit (HOSPITAL_COMMUNITY): Payer: BC Managed Care – PPO | Admitting: Psychiatry

## 2020-11-02 ENCOUNTER — Ambulatory Visit: Payer: Self-pay | Admitting: Internal Medicine

## 2020-12-17 ENCOUNTER — Encounter (HOSPITAL_COMMUNITY): Payer: Self-pay | Admitting: *Deleted

## 2020-12-17 ENCOUNTER — Emergency Department (HOSPITAL_COMMUNITY): Payer: BC Managed Care – PPO

## 2020-12-17 ENCOUNTER — Other Ambulatory Visit: Payer: Self-pay

## 2020-12-17 ENCOUNTER — Emergency Department (HOSPITAL_COMMUNITY)
Admission: EM | Admit: 2020-12-17 | Discharge: 2020-12-17 | Disposition: A | Discharge status: Home / Self Care | Payer: BC Managed Care – PPO | Attending: Emergency Medicine | Admitting: Emergency Medicine

## 2020-12-17 DIAGNOSIS — F17210 Nicotine dependence, cigarettes, uncomplicated: Secondary | ICD-10-CM | POA: Diagnosis not present

## 2020-12-17 DIAGNOSIS — N201 Calculus of ureter: Secondary | ICD-10-CM

## 2020-12-17 DIAGNOSIS — Z79899 Other long term (current) drug therapy: Secondary | ICD-10-CM | POA: Diagnosis not present

## 2020-12-17 DIAGNOSIS — I10 Essential (primary) hypertension: Secondary | ICD-10-CM | POA: Diagnosis not present

## 2020-12-17 DIAGNOSIS — R61 Generalized hyperhidrosis: Secondary | ICD-10-CM | POA: Diagnosis not present

## 2020-12-17 DIAGNOSIS — R1031 Right lower quadrant pain: Secondary | ICD-10-CM | POA: Diagnosis present

## 2020-12-17 DIAGNOSIS — N132 Hydronephrosis with renal and ureteral calculous obstruction: Secondary | ICD-10-CM | POA: Diagnosis not present

## 2020-12-17 LAB — CBC WITH DIFFERENTIAL/PLATELET
Abs Immature Granulocytes: 0.04 10*3/uL (ref 0.00–0.07)
Basophils Absolute: 0 10*3/uL (ref 0.0–0.1)
Basophils Relative: 0 %
Eosinophils Absolute: 0.1 10*3/uL (ref 0.0–0.5)
Eosinophils Relative: 1 %
HCT: 39.2 % (ref 36.0–46.0)
Hemoglobin: 12.9 g/dL (ref 12.0–15.0)
Immature Granulocytes: 0 %
Lymphocytes Relative: 33 %
Lymphs Abs: 3.5 10*3/uL (ref 0.7–4.0)
MCH: 30.9 pg (ref 26.0–34.0)
MCHC: 32.9 g/dL (ref 30.0–36.0)
MCV: 93.8 fL (ref 80.0–100.0)
Monocytes Absolute: 0.6 10*3/uL (ref 0.1–1.0)
Monocytes Relative: 5 %
Neutro Abs: 6.5 10*3/uL (ref 1.7–7.7)
Neutrophils Relative %: 61 %
Platelets: 243 10*3/uL (ref 150–400)
RBC: 4.18 MIL/uL (ref 3.87–5.11)
RDW: 13.2 % (ref 11.5–15.5)
WBC: 10.7 10*3/uL — ABNORMAL HIGH (ref 4.0–10.5)
nRBC: 0 % (ref 0.0–0.2)

## 2020-12-17 LAB — URINALYSIS, ROUTINE W REFLEX MICROSCOPIC
Bacteria, UA: NONE SEEN
Bilirubin Urine: NEGATIVE
Glucose, UA: NEGATIVE mg/dL
Ketones, ur: 5 mg/dL — AB
Leukocytes,Ua: NEGATIVE
Nitrite: NEGATIVE
Protein, ur: NEGATIVE mg/dL
Specific Gravity, Urine: 1.017 (ref 1.005–1.030)
pH: 7 (ref 5.0–8.0)

## 2020-12-17 LAB — COMPREHENSIVE METABOLIC PANEL
ALT: 21 U/L (ref 0–44)
AST: 19 U/L (ref 15–41)
Albumin: 3.7 g/dL (ref 3.5–5.0)
Alkaline Phosphatase: 111 U/L (ref 38–126)
Anion gap: 11 (ref 5–15)
BUN: 12 mg/dL (ref 6–20)
CO2: 22 mmol/L (ref 22–32)
Calcium: 8.3 mg/dL — ABNORMAL LOW (ref 8.9–10.3)
Chloride: 103 mmol/L (ref 98–111)
Creatinine, Ser: 0.86 mg/dL (ref 0.44–1.00)
GFR, Estimated: >60 mL/min (ref >60)
Glucose, Bld: 172 mg/dL — ABNORMAL HIGH (ref 70–99)
Potassium: 3.4 mmol/L — ABNORMAL LOW (ref 3.5–5.1)
Sodium: 136 mmol/L (ref 135–145)
Total Bilirubin: 0.8 mg/dL (ref 0.3–1.2)
Total Protein: 7.1 g/dL (ref 6.5–8.1)

## 2020-12-17 LAB — LIPASE, BLOOD: Lipase: 32 U/L (ref 11–51)

## 2020-12-17 MED ORDER — ONDANSETRON HCL 4 MG/2ML IJ SOLN: 4.0000 mg | Freq: Once | INTRAMUSCULAR | Status: DC

## 2020-12-17 MED ORDER — ONDANSETRON 4 MG PO TBDP: 4.0000 mg | ORAL_TABLET | Freq: Three times a day (TID) | ORAL | 0 refills | Status: DC | PRN

## 2020-12-17 MED ORDER — TAMSULOSIN HCL 0.4 MG PO CAPS: 0.4000 mg | ORAL_CAPSULE | Freq: Every day | ORAL | 0 refills | Status: AC

## 2020-12-17 MED ORDER — SODIUM CHLORIDE 0.9 % IV BOLUS
1000.0000 mL | Freq: Once | INTRAVENOUS | Status: AC
  Administered 2020-12-17: 1000 mL via INTRAVENOUS

## 2020-12-17 MED ORDER — OXYCODONE-ACETAMINOPHEN 5-325 MG PO TABS: 1.0000 | ORAL_TABLET | Freq: Four times a day (QID) | ORAL | 0 refills | Status: DC | PRN

## 2020-12-17 MED ORDER — FENTANYL CITRATE (PF) 100 MCG/2ML IJ SOLN: 50.0000 ug | Freq: Once | INTRAMUSCULAR | Status: DC

## 2020-12-17 NOTE — ED Provider Notes (Signed)
[facility] EMERGENCY DEPARTMENT Provider Note   CSN: [id number] Arrival date & time: 12/17/20  1045     History Chief Complaint  Patient presents with   Abdominal Pain    Joanne Frazier is a 55 y.o. female with past medical history significant for depression, hearing impairment, anemia, hypertension, irritable bowel syndrome, thyroid disease presenting to emergency department today with chief complaint of right abdominal and flank pain that started around 10 AM this morning.  Patient states the pain is constant and sharp.  It radiates to her pelvis.  She admits to feeling sweaty because of the pain.  She is nauseous as well.  She has not vomited.  No over-the-counter medications for symptoms prior to arrival.  Patient states she has a history of kidney stones in the past however this does not feel the same.  She denies any recent fall, injury, or trauma.  She is not anticoagulated.  Denies fever, chills, cough, congestion, chest pain, shortness of breath, gross hematuria, urinary frequency, dysuria, diarrhea.    Past Medical History:  Diagnosis Date   Depression    Headache(784.0)    Hearing impairment    History of anemia    Hypertension    Irritable bowel syndrome    Missed period 08/19/2013   Obesity    Thyroid disease     Patient Active Problem List   Diagnosis Date Noted   Missed period 08/19/2013   Decreased libido 08/18/2012   Depression 08/21/2011    History reviewed. No pertinent surgical history.   OB History     Gravida  3   Para  2   Term      Preterm      AB  1   Living  2      SAB  1   IAB      Ectopic      Multiple      Live Births  2           Family History  Problem Relation Age of Onset   Depression Sister    Depression Brother    Depression Brother    Alcohol abuse Paternal Uncle    Cirrhosis Paternal Uncle    Depression Maternal Grandmother        "Nervous Breakdown"   Seizures Father    ADD / ADHD Daughter    ADD /  ADHD Son    Cancer Maternal Grandfather        colon   Drug abuse Neg Hx    Dementia Neg Hx    Bipolar disorder Neg Hx    Anxiety disorder Neg Hx    OCD Neg Hx    Paranoid behavior Neg Hx    Schizophrenia Neg Hx    Sexual abuse Neg Hx    Physical abuse Neg Hx     Social History   Tobacco Use   Smoking status: Every Day    Packs/day: 0.50    Years: 9.00    Pack years: 4.50    Types: Cigarettes   Smokeless tobacco: Never  Vaping Use   Vaping Use: Never used  Substance Use Topics   Alcohol use: No   Drug use: No    Home Medications Prior to Admission medications   Medication Sig Start Date End Date Taking? Authorizing Provider  ondansetron (ZOFRAN ODT) 4 MG disintegrating tablet Take 1 tablet (4 mg total) by mouth every 8 (eight) hours as needed for nausea or vomiting. 12/17/20  Yes Walisiewicz,  Aimee Timmons E, PA-C  oxyCODONE-acetaminophen (PERCOCET/ROXICET) 5-325 MG tablet Take 1 tablet by mouth every 6 (six) hours as needed for severe pain. 12/17/20  Yes Walisiewicz, Janyth Riera E, PA-C  tamsulosin (FLOMAX) 0.4 MG CAPS capsule Take 1 capsule (0.4 mg total) by mouth daily after supper for 10 days. 12/17/20 12/27/20 Yes Walisiewicz, Toy Eisemann E, PA-C  citalopram (CELEXA) 40 MG tablet Take 1 tablet (40 mg total) by mouth daily. 09/24/19 09/23/20  Myrlene Brokeross, Deborah R, MD  lamoTRIgine (LAMICTAL) 25 MG tablet Take 2 tablets (50 mg total) by mouth 2 (two) times daily. 09/24/19   Myrlene Brokeross, Deborah R, MD    Allergies    Bupropion, Losartan, Metoprolol, Naproxen sodium, Sulfa antibiotics, and Prednisone  Review of Systems   Review of Systems All other systems are reviewed and are negative for acute change except as noted in the HPI.  Physical Exam Updated Vital Signs BP 128/77 (BP Location: Left Arm)   Pulse 74   Temp 99 F (37.2 C) (Oral)   Resp 20   Ht 5\' 6"  (1.676 m)   Wt 108 kg   SpO2 100%   BMI 38.41 kg/m   Physical Exam Vitals and nursing note reviewed.  Constitutional:      General:  She is in acute distress.     Appearance: She is diaphoretic. She is not toxic-appearing.     Comments: Unable to get comfortable during exam. Rolling on stretcher.   HENT:     Head: Normocephalic and atraumatic.     Right Ear: Tympanic membrane and external ear normal.     Left Ear: Tympanic membrane and external ear normal.     Nose: Nose normal.     Mouth/Throat:     Mouth: Mucous membranes are moist.     Pharynx: Oropharynx is clear.  Eyes:     General: No scleral icterus.       Right eye: No discharge.        Left eye: No discharge.     Extraocular Movements: Extraocular movements intact.     Conjunctiva/sclera: Conjunctivae normal.     Pupils: Pupils are equal, round, and reactive to light.  Neck:     Vascular: No JVD.  Cardiovascular:     Rate and Rhythm: Normal rate and regular rhythm.     Pulses: Normal pulses.          Radial pulses are 2+ on the right side and 2+ on the left side.     Heart sounds: Normal heart sounds.  Pulmonary:     Comments: Lungs clear to auscultation in all fields. Symmetric chest rise. No wheezing, rales, or rhonchi. Oxygen saturation is 100% on room air Abdominal:     Tenderness: There is no abdominal tenderness. There is right CVA tenderness.     Comments: Abdomen is soft, non-distended, and non-tender in all quadrants. No rigidity, no guarding. No peritoneal signs.  Musculoskeletal:        General: Normal range of motion.     Cervical back: Normal range of motion.  Skin:    General: Skin is warm.     Capillary Refill: Capillary refill takes less than 2 seconds.  Neurological:     Mental Status: She is alert and oriented to person, place, and time.     GCS: GCS eye subscore is 4. GCS verbal subscore is 5. GCS motor subscore is 6.     Comments: Fluent speech, no facial droop.  Psychiatric:  Behavior: Behavior normal.    ED Results / Procedures / Treatments   Labs (all labs ordered are listed, but only abnormal results are  displayed) Labs Reviewed  URINALYSIS, ROUTINE W REFLEX MICROSCOPIC - Abnormal; Notable for the following components:      Result Value   Hgb urine dipstick SMALL (*)    Ketones, ur 5 (*)    All other components within normal limits  COMPREHENSIVE METABOLIC PANEL - Abnormal; Notable for the following components:   Potassium 3.4 (*)    Glucose, Bld 172 (*)    Calcium 8.3 (*)    All other components within normal limits  CBC WITH DIFFERENTIAL/PLATELET - Abnormal; Notable for the following components:   WBC 10.7 (*)    All other components within normal limits  URINE CULTURE  LIPASE, BLOOD    EKG EKG Interpretation  Date/Time:  Saturday December 17 2020 11:05:10 EDT Ventricular Rate:  77 PR Interval:  219 QRS Duration: 94 QT Interval:  430 QTC Calculation: 487 R Axis:   62 Text Interpretation: Sinus rhythm Prolonged PR interval Probable left atrial enlargement Borderline T abnormalities, anterior leads Borderline prolonged QT interval Confirmed by Marianna Fuss ([id number]) on 12/17/2020 11:51:37 AM  Radiology CT Renal Stone Study  Result Date: 12/17/2020 CLINICAL DATA:  Right flank pain EXAM: CT ABDOMEN AND PELVIS WITHOUT CONTRAST TECHNIQUE: Multidetector CT imaging of the abdomen and pelvis was performed following the standard protocol without IV contrast. COMPARISON:  None. FINDINGS: Lower chest: No acute abnormality. Hepatobiliary: No focal hepatic abnormality. Gallbladder unremarkable. Pancreas: No focal abnormality or ductal dilatation. Spleen: No focal abnormality.  Normal size. Adrenals/Urinary Tract: 7 mm proximal right ureteral stone with mild right hydronephrosis. No hydronephrosis or stones on the left. Probable parapelvic cysts on the left. Adrenal glands and urinary bladder unremarkable. Stomach/Bowel: Stomach, large and small bowel grossly unremarkable. Appendix normal. Vascular/Lymphatic: No evidence of aneurysm or adenopathy. Reproductive: Uterus and adnexa unremarkable.  No  mass. Other: No free fluid or free air. Musculoskeletal: No acute bony abnormality. IMPRESSION: 7 mm proximal right ureteral stone with mild right hydronephrosis. Electronically Signed   By: Charlett Nose M.D.   On: 12/17/2020 12:56    Procedures Procedures   Medications Ordered in ED Medications  sodium chloride 0.9 % bolus 1,000 mL (0 mLs Intravenous Stopped 12/17/20 1341)    ED Course  I have reviewed the triage vital signs and the nursing notes.  Pertinent labs & imaging results that were available during my care of the patient were reviewed by me and considered in my medical decision making (see chart for details).    MDM Rules/Calculators/A&P                          History provided by patient and daughter with additional history obtained from chart review.    Presenting with sudden onset of right flank pain. She is afebrile, HDS. On my exam patient is comfortable appearing, diaphoretic and in acute distress.  Her lungs are clear to auscultation all fields and she has normal work of breathing.  She is not complaining of any chest pain or shortness of breath.  No chest tenderness on exam.  She has right CVA tenderness.  No abdominal tenderness.  Based on presentation and symptoms high suspicion for kidney stone etiology of pain, however will also get EKG and place patient on cardiac monitor.  Patient given fentanyl and Zofran with a liter of IV fluids.  EKG  shows sinus rhythm, borderline prolonged QTc at 47. CBC shows mild leukocytosis 10.7, no anemia, normal platelets. CMP shows glucose of 172, otherwise overall unremarkable.  Lipase within normal range.  UA is negative for signs of infection, does have small hemoglobinuria and 5 ketones with 6-10 RBC.  When RN went to give patient pain medicine her pain had resolved so the order was canceled.  Nausea had also resolved so Zofran discontinued as well.  CT renal shows 7 mm proximal right ureteral stone with mild right hydronephrosis.   Updated patient and her daughter with results.  Patient is resting comfortably.  Denies need for pain medication.  Work-up today is reassuring with no signs of sepsis, urinary tract infection, infected stone or renal insufficiency.  Patient is tolerating p.o. intake.  Will discharge patient home with Flomax, Percocet and Zofran.  She has an allergy documented for sulfa antibiotics with no known reaction type.  I asked patient about this and she said her mother was allergic to sulfa drugs and told all of the children that they likely were as well.  Patient has never had an allergic reaction to sulfa antibiotics.  Engage in shared decision-making with patient and she agrees with plan to try Flomax to help with passing the stone.  I have reviewed the PDMP during this encounter.  She has no recent or current narcotic prescriptions.  Patient will need follow-up with urology which I have provided in her discharge paperwork.  Patient agreeable with plan of care.    Portions of this note were generated with Scientist, clinical (histocompatibility and immunogenetics). Dictation errors may occur despite best attempts at proofreading.   Final Clinical Impression(s) / ED Diagnoses Final diagnoses:  Right ureteral stone    Rx / DC Orders ED Discharge Orders          Ordered    tamsulosin (FLOMAX) 0.4 MG CAPS capsule  Daily after supper        12/17/20 1338    ondansetron (ZOFRAN ODT) 4 MG disintegrating tablet  Every 8 hours PRN        12/17/20 1338    oxyCODONE-acetaminophen (PERCOCET/ROXICET) 5-325 MG tablet  Every 6 hours PRN        12/17/20 1338             Shanon Ace, PA-C 12/17/20 1346    Milagros Loll, MD 12/20/20 1538

## 2020-12-17 NOTE — ED Notes (Signed)
Pt ambulatory to the restroom without difficulty. Gait steady and even.  

## 2020-12-17 NOTE — ED Triage Notes (Signed)
Pt c/o right abdominal and flank pain that started today

## 2020-12-17 NOTE — ED Notes (Signed)
Pt provided with ice water

## 2020-12-17 NOTE — Discharge Instructions (Addendum)
-  CT scan shows 7 mm proximal right ureteral stone   You should pass this.  Drink plenty of water.  3 prescriptions were sent to your pharmacy to help you pass the stone: Flomax.  This is a medicine to help dilate your ureter so that the stone can pass easily.  Take it at dinnertime and be careful when changing positions as we discussed that can make you lightheaded and dizzy when standing up.  Try to change positions slowly and if you become dizzy sit down until it resolves. Zofran.  This is a nausea medicine take as prescribed if needed. Percocet.  This is a narcotic pain medicine for severe pain only.  Do not take if you are going to be driving or working because it can make you drowsy.  Take with food so it does not upset your stomach.  If you are having mild to moderate pain you can take Tylenol, just to take at the same time as of Percocet as we discussed.  Follow-up with the urologist to be having difficulty passing the stone.  Good luck moving and stay well-hydrated!

## 2020-12-17 NOTE — ED Notes (Signed)
Pt states unable to urinate at this time. 

## 2020-12-17 NOTE — ED Notes (Signed)
Patient Alert and oriented to baseline. Stable and ambulatory to baseline. Patient verbalized understanding of the discharge instructions.  Patient belongings were taken by the patient.   

## 2020-12-19 LAB — URINE CULTURE

## 2021-03-29 ENCOUNTER — Other Ambulatory Visit: Payer: Self-pay

## 2021-03-29 ENCOUNTER — Encounter (HOSPITAL_COMMUNITY): Payer: Self-pay

## 2021-03-29 ENCOUNTER — Emergency Department (HOSPITAL_COMMUNITY)
Admission: EM | Admit: 2021-03-29 | Discharge: 2021-03-29 | Disposition: A | Discharge status: Home / Self Care | Payer: BC Managed Care – PPO | Attending: Emergency Medicine | Admitting: Emergency Medicine

## 2021-03-29 ENCOUNTER — Emergency Department (HOSPITAL_COMMUNITY): Payer: BC Managed Care – PPO

## 2021-03-29 DIAGNOSIS — F17210 Nicotine dependence, cigarettes, uncomplicated: Secondary | ICD-10-CM | POA: Insufficient documentation

## 2021-03-29 DIAGNOSIS — R109 Unspecified abdominal pain: Secondary | ICD-10-CM | POA: Diagnosis present

## 2021-03-29 DIAGNOSIS — N200 Calculus of kidney: Secondary | ICD-10-CM | POA: Diagnosis not present

## 2021-03-29 DIAGNOSIS — I10 Essential (primary) hypertension: Secondary | ICD-10-CM | POA: Diagnosis not present

## 2021-03-29 LAB — CBC WITH DIFFERENTIAL/PLATELET
Abs Immature Granulocytes: 0.07 K/uL (ref 0.00–0.07)
Basophils Absolute: 0 K/uL (ref 0.0–0.1)
Basophils Relative: 0 %
Eosinophils Absolute: 0 K/uL (ref 0.0–0.5)
Eosinophils Relative: 0 %
HCT: 39.1 % (ref 36.0–46.0)
Hemoglobin: 13 g/dL (ref 12.0–15.0)
Immature Granulocytes: 1 %
Lymphocytes Relative: 11 %
Lymphs Abs: 1.3 K/uL (ref 0.7–4.0)
MCH: 31.3 pg (ref 26.0–34.0)
MCHC: 33.2 g/dL (ref 30.0–36.0)
MCV: 94.2 fL (ref 80.0–100.0)
Monocytes Absolute: 0.3 K/uL (ref 0.1–1.0)
Monocytes Relative: 2 %
Neutro Abs: 10.9 K/uL — ABNORMAL HIGH (ref 1.7–7.7)
Neutrophils Relative %: 86 %
Platelets: 221 K/uL (ref 150–400)
RBC: 4.15 MIL/uL (ref 3.87–5.11)
RDW: 13.2 % (ref 11.5–15.5)
WBC: 12.6 K/uL — ABNORMAL HIGH (ref 4.0–10.5)
nRBC: 0 % (ref 0.0–0.2)

## 2021-03-29 LAB — URINALYSIS, ROUTINE W REFLEX MICROSCOPIC
Bilirubin Urine: NEGATIVE
Glucose, UA: NEGATIVE mg/dL
Hgb urine dipstick: NEGATIVE
Ketones, ur: NEGATIVE mg/dL
Leukocytes,Ua: NEGATIVE
Nitrite: NEGATIVE
Protein, ur: NEGATIVE mg/dL
Specific Gravity, Urine: 1.019 (ref 1.005–1.030)
pH: 9 — ABNORMAL HIGH (ref 5.0–8.0)

## 2021-03-29 LAB — COMPREHENSIVE METABOLIC PANEL WITH GFR
ALT: 17 U/L (ref 0–44)
AST: 19 U/L (ref 15–41)
Albumin: 4.3 g/dL (ref 3.5–5.0)
Alkaline Phosphatase: 114 U/L (ref 38–126)
Anion gap: 8 (ref 5–15)
BUN: 23 mg/dL — ABNORMAL HIGH (ref 6–20)
CO2: 27 mmol/L (ref 22–32)
Calcium: 8.9 mg/dL (ref 8.9–10.3)
Chloride: 103 mmol/L (ref 98–111)
Creatinine, Ser: 1.07 mg/dL — ABNORMAL HIGH (ref 0.44–1.00)
GFR, Estimated: >60 mL/min
Glucose, Bld: 156 mg/dL — ABNORMAL HIGH (ref 70–99)
Potassium: 3.3 mmol/L — ABNORMAL LOW (ref 3.5–5.1)
Sodium: 138 mmol/L (ref 135–145)
Total Bilirubin: 0.9 mg/dL (ref 0.3–1.2)
Total Protein: 7.9 g/dL (ref 6.5–8.1)

## 2021-03-29 LAB — HCG, QUANTITATIVE, PREGNANCY: hCG, Beta Chain, Quant, S: 3 m[IU]/mL

## 2021-03-29 LAB — LIPASE, BLOOD: Lipase: 32 U/L (ref 11–51)

## 2021-03-29 MED ORDER — FENTANYL CITRATE PF 50 MCG/ML IJ SOSY
50.0000 ug | PREFILLED_SYRINGE | Freq: Once | INTRAMUSCULAR | Status: AC
  Administered 2021-03-29: 50 ug via INTRAVENOUS
  Filled 2021-03-29: qty 1

## 2021-03-29 MED ORDER — IOHEXOL 300 MG/ML  SOLN
100.0000 mL | Freq: Once | INTRAMUSCULAR | Status: AC | PRN
  Administered 2021-03-29: 100 mL via INTRAVENOUS

## 2021-03-29 MED ORDER — SODIUM CHLORIDE 0.9 % IV BOLUS
1000.0000 mL | Freq: Once | INTRAVENOUS | Status: AC
  Administered 2021-03-29: 1000 mL via INTRAVENOUS

## 2021-03-29 MED ORDER — OXYCODONE-ACETAMINOPHEN 5-325 MG PO TABS: 1.0000 | ORAL_TABLET | Freq: Four times a day (QID) | ORAL | 0 refills | Status: DC | PRN

## 2021-03-29 MED ORDER — ONDANSETRON HCL 4 MG/2ML IJ SOLN
4.0000 mg | Freq: Once | INTRAMUSCULAR | Status: AC
  Administered 2021-03-29: 4 mg via INTRAVENOUS
  Filled 2021-03-29: qty 2

## 2021-03-29 MED ORDER — ONDANSETRON 4 MG PO TBDP
ORAL_TABLET | ORAL | 0 refills | Status: DC
Start: 2021-03-29 — End: 2021-04-03

## 2021-03-29 NOTE — Discharge Instructions (Addendum)
Call [facility] and set up an appointment later this week for recheck for your kidney stone.  Return if any problem

## 2021-03-29 NOTE — ED Provider Notes (Signed)
[facility] EMERGENCY DEPARTMENT Provider Note   CSN: [id number] Arrival date & time: 03/29/21  0600     History Chief Complaint  Patient presents with   Nephrolithiasis    Joanne Frazier is a 55 y.o. female.   Flank Pain This is a recurrent problem. The current episode started 3 to 5 hours ago. The problem occurs constantly. The problem has not changed since onset.Associated symptoms include abdominal pain. Pertinent negatives include no chest pain, no headaches and no shortness of breath. Nothing aggravates the symptoms. Nothing relieves the symptoms. She has tried nothing for the symptoms. The treatment provided no relief.      Past Medical History:  Diagnosis Date   Depression    Headache(784.0)    Hearing impairment    History of anemia    Hypertension    Irritable bowel syndrome    Missed period 08/19/2013   Obesity    Thyroid disease     Patient Active Problem List   Diagnosis Date Noted   Missed period 08/19/2013   Decreased libido 08/18/2012   Depression 08/21/2011    History reviewed. No pertinent surgical history.   OB History     Gravida  3   Para  2   Term      Preterm      AB  1   Living  2      SAB  1   IAB      Ectopic      Multiple      Live Births  2           Family History  Problem Relation Age of Onset   Depression Sister    Depression Brother    Depression Brother    Alcohol abuse Paternal Uncle    Cirrhosis Paternal Uncle    Depression Maternal Grandmother        "Nervous Breakdown"   Seizures Father    ADD / ADHD Daughter    ADD / ADHD Son    Cancer Maternal Grandfather        colon   Drug abuse Neg Hx    Dementia Neg Hx    Bipolar disorder Neg Hx    Anxiety disorder Neg Hx    OCD Neg Hx    Paranoid behavior Neg Hx    Schizophrenia Neg Hx    Sexual abuse Neg Hx    Physical abuse Neg Hx     Social History   Tobacco Use   Smoking status: Every Day    Packs/day: 0.50    Years: 9.00    Pack  years: 4.50    Types: Cigarettes   Smokeless tobacco: Never  Vaping Use   Vaping Use: Never used  Substance Use Topics   Alcohol use: No   Drug use: No    Home Medications Prior to Admission medications   Medication Sig Start Date End Date Taking? Authorizing Provider  citalopram (CELEXA) 40 MG tablet Take 1 tablet (40 mg total) by mouth daily. 09/24/19 09/23/20  Myrlene Broker, MD  lamoTRIgine (LAMICTAL) 25 MG tablet Take 2 tablets (50 mg total) by mouth 2 (two) times daily. 09/24/19   Myrlene Broker, MD  ondansetron (ZOFRAN ODT) 4 MG disintegrating tablet Take 1 tablet (4 mg total) by mouth every 8 (eight) hours as needed for nausea or vomiting. 12/17/20   Shanon Ace, PA-C  oxyCODONE-acetaminophen (PERCOCET/ROXICET) 5-325 MG tablet Take 1 tablet by mouth every 6 (six) hours as  needed for severe pain. 12/17/20   Namon Cirri E, PA-C    Allergies    Bupropion, Losartan, Metoprolol, Naproxen sodium, Sulfa antibiotics, and Prednisone  Review of Systems   Review of Systems  Respiratory:  Negative for shortness of breath.   Cardiovascular:  Negative for chest pain.  Gastrointestinal:  Positive for abdominal pain.  Genitourinary:  Positive for flank pain.  Neurological:  Negative for headaches.  All other systems reviewed and are negative.  Physical Exam Updated Vital Signs BP (!) 144/79   Pulse 91   Temp 97.8 F (36.6 C) (Oral)   Ht 5\' 6"  (1.676 m)   Wt 108 kg   SpO2 100%   BMI 38.43 kg/m   Physical Exam Vitals and nursing note reviewed.  Constitutional:      Appearance: She is well-developed.  HENT:     Head: Normocephalic and atraumatic.  Eyes:     Pupils: Pupils are equal, round, and reactive to light.  Cardiovascular:     Rate and Rhythm: Normal rate and regular rhythm.  Pulmonary:     Effort: Pulmonary effort is normal. No respiratory distress.     Breath sounds: No stridor.  Abdominal:     General: Abdomen is flat. There is no distension.   Musculoskeletal:        General: No swelling or tenderness. Normal range of motion.     Cervical back: Normal range of motion.  Skin:    General: Skin is warm and dry.  Neurological:     General: No focal deficit present.     Mental Status: She is alert.    ED Results / Procedures / Treatments   Labs (all labs ordered are listed, but only abnormal results are displayed) Labs Reviewed  CBC WITH DIFFERENTIAL/PLATELET  COMPREHENSIVE METABOLIC PANEL  URINALYSIS, ROUTINE W REFLEX MICROSCOPIC  HCG, QUANTITATIVE, PREGNANCY  LIPASE, BLOOD    EKG None  Radiology No results found.  Procedures Procedures   Medications Ordered in ED Medications  sodium chloride 0.9 % bolus 1,000 mL (has no administration in time range)  ondansetron (ZOFRAN) injection 4 mg (has no administration in time range)  fentaNYL (SUBLIMAZE) injection 50 mcg (has no administration in time range)    ED Course  I have reviewed the triage vital signs and the nursing notes.  Pertinent labs & imaging results that were available during my care of the patient were reviewed by me and considered in my medical decision making (see chart for details).    MDM Rules/Calculators/A&P                         Eval for kidney stone, biliary colic, appendicitis. Symptomatic treatment in mean time.   Care trnasferred to Dr. pending CT/lab results, reevaluation and ultimate disposition.   Final Clinical Impression(s) / ED Diagnoses Final diagnoses:  None    Rx / DC Orders ED Discharge Orders     None        Fletcher Rathbun, Estell Harpin, MD 03/30/21 [phone]

## 2021-03-29 NOTE — ED Triage Notes (Addendum)
[facility] from home. Right side flank pain. States she has vomited from the pain. Pain started at 2am. Hx of kidney stones.

## 2021-03-29 NOTE — ED Notes (Signed)
Patient transported to CT 

## 2021-03-29 NOTE — ED Provider Notes (Signed)
Patient has a 9 mm mid ureteral stone.  She is no longer having any pain or nausea.  She will be discharged with Percocet and Zofran and is instructed to follow-up with urology this week and return if any problems   Bethann Berkshire, MD 03/29/21 [phone]

## 2021-03-31 ENCOUNTER — Emergency Department (HOSPITAL_COMMUNITY)
Admission: EM | Admit: 2021-03-31 | Discharge: 2021-03-31 | Disposition: A | Discharge status: Home / Self Care | Payer: BC Managed Care – PPO | Attending: Emergency Medicine | Admitting: Emergency Medicine

## 2021-03-31 ENCOUNTER — Encounter (HOSPITAL_COMMUNITY): Payer: Self-pay | Admitting: *Deleted

## 2021-03-31 ENCOUNTER — Emergency Department (HOSPITAL_COMMUNITY): Payer: BC Managed Care – PPO

## 2021-03-31 DIAGNOSIS — I10 Essential (primary) hypertension: Secondary | ICD-10-CM | POA: Insufficient documentation

## 2021-03-31 DIAGNOSIS — F17210 Nicotine dependence, cigarettes, uncomplicated: Secondary | ICD-10-CM | POA: Diagnosis not present

## 2021-03-31 DIAGNOSIS — N200 Calculus of kidney: Secondary | ICD-10-CM | POA: Diagnosis not present

## 2021-03-31 DIAGNOSIS — R109 Unspecified abdominal pain: Secondary | ICD-10-CM | POA: Diagnosis present

## 2021-03-31 MED ORDER — TAMSULOSIN HCL 0.4 MG PO CAPS: 0.4000 mg | ORAL_CAPSULE | Freq: Every day | ORAL | 0 refills | Status: DC

## 2021-03-31 MED ORDER — ONDANSETRON 8 MG PO TBDP
8.0000 mg | ORAL_TABLET | Freq: Once | ORAL | Status: AC
  Administered 2021-03-31: 8 mg via ORAL
  Filled 2021-03-31: qty 1

## 2021-03-31 MED ORDER — HYDROMORPHONE HCL 1 MG/ML IJ SOLN
1.0000 mg | Freq: Once | INTRAMUSCULAR | Status: AC
  Administered 2021-03-31: 1 mg via INTRAMUSCULAR
  Filled 2021-03-31: qty 1

## 2021-03-31 MED ORDER — KETOROLAC TROMETHAMINE 60 MG/2ML IM SOLN
60.0000 mg | Freq: Once | INTRAMUSCULAR | Status: AC
  Administered 2021-03-31: 60 mg via INTRAMUSCULAR
  Filled 2021-03-31: qty 2

## 2021-03-31 NOTE — ED Notes (Signed)
Patient c/o right flank pain worsening around 7am today with some nausea. Took her prescribed pain medicine this am w/o relief. Has not called urology for appt yet.

## 2021-03-31 NOTE — Discharge Instructions (Addendum)
You have a large right-sided kidney stone seen on your prior CT.  It is important that you call Dr. Dimas Millin office today to arrange an appointment to be seen in his office on Monday.  Take the pain medication as directed.  Also start the tamsulosin today.  Return to the emergency department for any new or worsening symptoms.

## 2021-03-31 NOTE — ED Triage Notes (Signed)
Seen 2 days ago for kidney stone, pain in right flank today

## 2021-03-31 NOTE — ED Notes (Signed)
Patient transported to X-ray 

## 2021-04-01 NOTE — ED Provider Notes (Signed)
[facility] EMERGENCY DEPARTMENT Provider Note   CSN: [id number] Arrival date & time: 03/31/21  1045     History Chief Complaint  Patient presents with   Flank Pain    Joanne Frazier is a 55 y.o. female.   Flank Pain Pertinent negatives include no chest pain, no abdominal pain, no headaches and no shortness of breath.      Joanne Frazier is a 55 y.o. female with past medical history of hypertension, thyroid disease, and hearing impairment.  She presents to the Emergency Department complaining of persistent right flank pain.  She was seen here for similar symptoms on 03/29/2021 and diagnosed with right-sided kidney stone.  Pain seemed to improve after ER discharge but returned on the morning of arrival.  She describes a constant, sharp pain of her right flank that radiates toward her groin.  Nothing makes her pain better or worse.  States that she took one of her prescribed pain pills without improvement.  She has not contacted urology to arrange follow-up appointment.  Her pain is been associated with nausea, she denies fever, chills, vomiting, dysuria and diarrhea.    Past Medical History:  Diagnosis Date   Depression    Headache(784.0)    Hearing impairment    History of anemia    Hypertension    Irritable bowel syndrome    Missed period 08/19/2013   Obesity    Thyroid disease     Patient Active Problem List   Diagnosis Date Noted   Missed period 08/19/2013   Decreased libido 08/18/2012   Depression 08/21/2011    History reviewed. No pertinent surgical history.   OB History     Gravida  3   Para  2   Term      Preterm      AB  1   Living  2      SAB  1   IAB      Ectopic      Multiple      Live Births  2           Family History  Problem Relation Age of Onset   Depression Sister    Depression Brother    Depression Brother    Alcohol abuse Paternal Uncle    Cirrhosis Paternal Uncle    Depression Maternal Grandmother         "Nervous Breakdown"   Seizures Father    ADD / ADHD Daughter    ADD / ADHD Son    Cancer Maternal Grandfather        colon   Drug abuse Neg Hx    Dementia Neg Hx    Bipolar disorder Neg Hx    Anxiety disorder Neg Hx    OCD Neg Hx    Paranoid behavior Neg Hx    Schizophrenia Neg Hx    Sexual abuse Neg Hx    Physical abuse Neg Hx     Social History   Tobacco Use   Smoking status: Every Day    Packs/day: 0.50    Years: 9.00    Pack years: 4.50    Types: Cigarettes   Smokeless tobacco: Never  Vaping Use   Vaping Use: Never used  Substance Use Topics   Alcohol use: No   Drug use: No    Home Medications Prior to Admission medications   Medication Sig Start Date End Date Taking? Authorizing Provider  tamsulosin (FLOMAX) 0.4 MG CAPS capsule Take 1 capsule (0.4 mg  total) by mouth daily. 03/31/21  Yes Shekia Kuper, PA-C  citalopram (CELEXA) 40 MG tablet Take 1 tablet (40 mg total) by mouth daily. 09/24/19 09/23/20  Myrlene Broker, MD  lamoTRIgine (LAMICTAL) 25 MG tablet Take 2 tablets (50 mg total) by mouth 2 (two) times daily. 09/24/19   Myrlene Broker, MD  ondansetron (ZOFRAN ODT) 4 MG disintegrating tablet 4mg  ODT q4 hours prn nausea/vomit 03/29/21   03/31/21, MD  oxyCODONE-acetaminophen (PERCOCET) 5-325 MG tablet Take 1 tablet by mouth every 6 (six) hours as needed. 03/29/21   03/31/21, MD    Allergies    Bupropion, Losartan, Metoprolol, Naproxen sodium, Sulfa antibiotics, and Prednisone  Review of Systems   Review of Systems  Constitutional:  Negative for chills, fatigue and fever.  Respiratory:  Negative for cough, shortness of breath and wheezing.   Cardiovascular:  Negative for chest pain.  Gastrointestinal:  Negative for abdominal pain, nausea and vomiting.  Genitourinary:  Positive for flank pain. Negative for decreased urine volume, difficulty urinating, dysuria, hematuria, vaginal bleeding and vaginal discharge.  Musculoskeletal:  Negative for  arthralgias, back pain and myalgias.  Skin:  Negative for rash.  Neurological:  Negative for dizziness, weakness, numbness and headaches.  Hematological:  Does not bruise/bleed easily.  Psychiatric/Behavioral:  Negative for confusion.    Physical Exam Updated Vital Signs BP 125/80 (BP Location: Right Arm)   Pulse 93   Temp 98 F (36.7 C) (Oral)   Resp 18   Ht 5\' 6"  (1.676 m)   Wt 108 kg   SpO2 94%   BMI 38.43 kg/m   Physical Exam Vitals and nursing note reviewed.  Constitutional:      General: She is not in acute distress.    Appearance: Normal appearance.     Comments: Patient is grimacing appears uncomfortable. Holding right flank area.   HENT:     Mouth/Throat:     Mouth: Mucous membranes are moist.  Cardiovascular:     Rate and Rhythm: Normal rate and regular rhythm.     Pulses: Normal pulses.  Pulmonary:     Effort: Pulmonary effort is normal.     Breath sounds: Normal breath sounds.  Abdominal:     Palpations: Abdomen is soft.     Tenderness: There is no abdominal tenderness. There is no right CVA tenderness or left CVA tenderness.  Musculoskeletal:        General: Normal range of motion.  Skin:    General: Skin is warm.     Capillary Refill: Capillary refill takes less than 2 seconds.     Findings: No rash.  Neurological:     General: No focal deficit present.     Mental Status: She is alert.     Sensory: No sensory deficit.     Motor: No weakness.    ED Results / Procedures / Treatments   Labs (all labs ordered are listed, but only abnormal results are displayed) Labs Reviewed - No data to display  EKG None  Radiology DG Abdomen 1 View  Result Date: 03/31/2021 CLINICAL DATA:  Right flank pain, known kidney stone. EXAM: ABDOMEN - 1 VIEW COMPARISON:  Correlation made with recent CT abdomen FINDINGS: Right ureteral calculus on the CT is not identified. Overlying bowel gas is present. No additional calculi identified. Bowel gas pattern is  unremarkable. IMPRESSION: Right ureteral calculus on prior CT is not identified and may be obscured by overlying bowel gas. Electronically Signed   By:  M.D.   On: 03/31/2021 13:56    Procedures Procedures   Medications Ordered in ED Medications  HYDROmorphone (DILAUDID) injection 1 mg (1 mg Intramuscular Given 03/31/21 1237)  ondansetron (ZOFRAN-ODT) disintegrating tablet 8 mg (8 mg Oral Given 03/31/21 1237)  ketorolac (TORADOL) injection 60 mg (60 mg Intramuscular Given 03/31/21 1239)    ED Course  I have reviewed the triage vital signs and the nursing notes.  Pertinent labs & imaging results that were available during my care of the patient were reviewed by me and considered in my medical decision making (see chart for details).    MDM Rules/Calculators/A&P                           Patient seen here 2 days ago for right flank pain and found to have a 9 mm mid ureteral stone.  Was advised to follow-up with urology, patient has hearing impairment and states she misunderstood what her discharge instructions were.  Here today due to increasing right flank pain.  On exam, patient appears uncomfortable, nontoxic.  Holding her right flank area.  No CVA tenderness on exam.  No reported dysuria symptoms, fever, chills, or vomiting.  Vital signs reassuring.  Patient had urinalysis 2 days ago without evidence of hematuria or infection.  Negative beta hCG.  Patient reports perimenopausal.  Given size of her ureteral stone and need for urological follow-up.  Will consult urology on-call.   On recheck, patient has received pain medication and reports feeling much better.  Pain has resolved at this time.  I do not feel that she needs further blood work or imaging at this time given known cause of her right flank pain  Discussed findings with Dr. Ronne Binning who is agreeable to see patient in office on Monday.  He requests KUB.  Patient has pain medication and antiemetic at home.   Prescription will also be written for tamsulosin.  Patient agreeable to plan, all questions were answered.  She appears appropriate for discharge home.  Return precautions were discussed.   Final Clinical Impression(s) / ED Diagnoses Final diagnoses:  Kidney stone    Rx / DC Orders ED Discharge Orders          Ordered    tamsulosin (FLOMAX) 0.4 MG CAPS capsule  Daily        03/31/21 1414             Pauline Aus, PA-C 04/01/21 1612    Gloris Manchester, MD 04/02/21 0028

## 2021-04-03 ENCOUNTER — Ambulatory Visit: Payer: BC Managed Care – PPO | Admitting: Urology

## 2021-04-03 ENCOUNTER — Encounter: Payer: Self-pay | Admitting: Urology

## 2021-04-03 ENCOUNTER — Other Ambulatory Visit: Payer: Self-pay

## 2021-04-03 VITALS — BP 116/74 | HR 89 | Wt 238.0 lb

## 2021-04-03 DIAGNOSIS — N200 Calculus of kidney: Secondary | ICD-10-CM | POA: Diagnosis not present

## 2021-04-03 LAB — MICROSCOPIC EXAMINATION: Renal Epithel, UA: NONE SEEN /hpf

## 2021-04-03 LAB — URINALYSIS, ROUTINE W REFLEX MICROSCOPIC
Bilirubin, UA: NEGATIVE
Glucose, UA: NEGATIVE
Leukocytes,UA: NEGATIVE
Nitrite, UA: NEGATIVE
Protein,UA: NEGATIVE
RBC, UA: NEGATIVE
Specific Gravity, UA: 1.02 (ref 1.005–1.030)
Urobilinogen, Ur: 1 mg/dL (ref 0.2–1.0)
pH, UA: 7 (ref 5.0–7.5)

## 2021-04-03 MED ORDER — POLYETHYLENE GLYCOL 3350 17 G PO PACK: 17.0000 g | PACK | Freq: Every day | ORAL | 0 refills | Status: DC

## 2021-04-03 MED ORDER — ONDANSETRON 4 MG PO TBDP: ORAL_TABLET | ORAL | 0 refills | Status: DC

## 2021-04-03 MED ORDER — OXYCODONE-ACETAMINOPHEN 5-325 MG PO TABS: 1.0000 | ORAL_TABLET | ORAL | 0 refills | Status: DC | PRN

## 2021-04-03 MED ORDER — TAMSULOSIN HCL 0.4 MG PO CAPS: 0.4000 mg | ORAL_CAPSULE | Freq: Every day | ORAL | 0 refills | Status: DC

## 2021-04-03 NOTE — Progress Notes (Signed)
Urological Symptom Review  Patient is experiencing the following symptoms: Get up at night to urinate Blood in urine Kidney stones   Review of Systems  Gastrointestinal (upper)  : Negative for upper GI symptoms  Gastrointestinal (lower) : Constipation  Constitutional : Negative for symptoms  Skin: Negative for skin symptoms  Eyes: Blurred vision  Ear/Nose/Throat : Negative for Ear/Nose/Throat symptoms  Hematologic/Lymphatic: Negative for Hematologic/Lymphatic symptoms  Cardiovascular : Negative for cardiovascular symptoms  Respiratory : Negative for respiratory symptoms  Endocrine: Negative for endocrine symptoms  Musculoskeletal: Back pain Joint pain  Neurological: Headaches Dizziness  Psychologic: Depression Anxiety

## 2021-04-03 NOTE — H&P (View-Only) (Signed)
04/03/2021 2:41 PM   Joanne Frazier 01-05-1966 [id number]  Referring provider: Practice, [facility] [address] [place],  [place] [address]  nephrolithiasis   HPI: Joanne Frazier is a 55yo here for evaluation of nephrolithiasis. Starting 10/19 she developed right flank pain and presented to the ER. She was diagnosed with a 32mm right proximal ureteral calculus. This is her 4th stone event. Her last stone event was in July 2022.  The pain was sharp, constant, severe and nonraditing. She is currently taking oxycodone which improves her pain. No nausea or vomiting. She has had intermittent gross hematuria since the 19th. No LUTS and no dysuria.    PMH: Past Medical History:  Diagnosis Date   Depression    Headache(784.0)    Hearing impairment    History of anemia    Hypertension    Irritable bowel syndrome    Missed period 08/19/2013   Obesity    Thyroid disease     Surgical History: No past surgical history on file.  Home Medications:  Allergies as of 04/03/2021       Reactions   Bupropion Other (See Comments)   Depression   Losartan Other (See Comments)   Jitteriness    Metoprolol Nausea And Vomiting   Naproxen Sodium Other (See Comments)   Funny feeling   Sulfa Antibiotics Other (See Comments)   Unknown.    Prednisone Other (See Comments), Palpitations   Shakiness        Medication List        Accurate as of April 03, 2021  2:41 PM. If you have any questions, ask your nurse or doctor.          amLODipine 5 MG tablet Commonly known as: NORVASC SMARTSIG:1 Tablet(s) By Mouth Every Evening   citalopram 40 MG tablet Commonly known as: CeleXA Take 1 tablet (40 mg total) by mouth daily.   DULoxetine 30 MG capsule Commonly known as: CYMBALTA 1 capsule   lamoTRIgine 25 MG tablet Commonly known as: LaMICtal Take 2 tablets (50 mg total) by mouth 2 (two) times daily.   ondansetron 4 MG disintegrating tablet Commonly known as: Zofran ODT 4mg  ODT q4  hours prn nausea/vomit   oxyCODONE-acetaminophen 5-325 MG tablet Commonly known as: Percocet Take 1 tablet by mouth every 6 (six) hours as needed.   tamsulosin 0.4 MG Caps capsule Commonly known as: FLOMAX Take 1 capsule (0.4 mg total) by mouth daily.        Allergies:  Allergies  Allergen Reactions   Bupropion Other (See Comments)    Depression   Losartan Other (See Comments)    Jitteriness    Metoprolol Nausea And Vomiting   Naproxen Sodium Other (See Comments)    Funny feeling   Sulfa Antibiotics Other (See Comments)    Unknown.    Prednisone Other (See Comments) and Palpitations    Shakiness    Family History: Family History  Problem Relation Age of Onset   Depression Sister    Depression Brother    Depression Brother    Alcohol abuse Paternal Uncle    Cirrhosis Paternal Uncle    Depression Maternal Grandmother        "Nervous Breakdown"   Seizures Father    ADD / ADHD Daughter    ADD / ADHD Son    Cancer Maternal Grandfather        colon   Drug abuse Neg Hx    Dementia Neg Hx    Bipolar disorder Neg  Hx    Anxiety disorder Neg Hx    OCD Neg Hx    Paranoid behavior Neg Hx    Schizophrenia Neg Hx    Sexual abuse Neg Hx    Physical abuse Neg Hx     Social History:  reports that she has been smoking cigarettes. She has a 4.50 pack-year smoking history. She has never used smokeless tobacco. She reports that she does not drink alcohol and does not use drugs.  ROS: All other review of systems were reviewed and are negative except what is noted above in HPI  Physical Exam: BP 116/74   Pulse 89   Wt 238 lb (108 kg)   BMI 38.41 kg/m   Constitutional:  Alert and oriented, No acute distress. HEENT: [place] AT, moist mucus membranes.  Trachea midline, no masses. Cardiovascular: No clubbing, cyanosis, or edema. Respiratory: Normal respiratory effort, no increased work of breathing. GI: Abdomen is soft, nontender, nondistended, no abdominal masses GU: No CVA  tenderness.  Lymph: No cervical or inguinal lymphadenopathy. Skin: No rashes, bruises or suspicious lesions. Neurologic: Grossly intact, no focal deficits, moving all 4 extremities. Psychiatric: Normal mood and affect.  Laboratory Data: Lab Results  Component Value Date   WBC 12.6 (H) 03/29/2021   HGB 13.0 03/29/2021   HCT 39.1 03/29/2021   MCV 94.2 03/29/2021   PLT 221 03/29/2021    Lab Results  Component Value Date   CREATININE 1.07 (H) 03/29/2021    No results found for: PSA  No results found for: TESTOSTERONE  No results found for: HGBA1C  Urinalysis    Component Value Date/Time   COLORURINE YELLOW 03/29/2021 0800   APPEARANCEUR CLEAR 03/29/2021 0800   LABSPEC 1.019 03/29/2021 0800   PHURINE 9.0 (H) 03/29/2021 0800   GLUCOSEU NEGATIVE 03/29/2021 0800   HGBUR NEGATIVE 03/29/2021 0800   BILIRUBINUR NEGATIVE 03/29/2021 0800   KETONESUR NEGATIVE 03/29/2021 0800   PROTEINUR NEGATIVE 03/29/2021 0800   UROBILINOGEN 0.2 10/18/2008 2050   NITRITE NEGATIVE 03/29/2021 0800   LEUKOCYTESUR NEGATIVE 03/29/2021 0800    Lab Results  Component Value Date   BACTERIA NONE SEEN 12/17/2020    Pertinent Imaging: CT 03/29/2021: Images reviewed and discussed with the patient Results for orders placed during the hospital encounter of 03/31/21  DG Abdomen 1 View  Narrative CLINICAL DATA:  Right flank pain, known kidney stone.  EXAM: ABDOMEN - 1 VIEW  COMPARISON:  Correlation made with recent CT abdomen  FINDINGS: Right ureteral calculus on the CT is not identified. Overlying bowel gas is present. No additional calculi identified. Bowel gas pattern is unremarkable.  IMPRESSION: Right ureteral calculus on prior CT is not identified and may be obscured by overlying bowel gas.   Electronically Signed By: Praneil  Patel M.D. On: 03/31/2021 13:56  No results found for this or any previous visit.  No results found for this or any previous visit.  No results found  for this or any previous visit.  No results found for this or any previous visit.  No results found for this or any previous visit.  No results found for this or any previous visit.  Results for orders placed during the hospital encounter of 12/17/20  CT Renal Stone Study  Narrative CLINICAL DATA:  Right flank pain  EXAM: CT ABDOMEN AND PELVIS WITHOUT CONTRAST  TECHNIQUE: Multidetector CT imaging of the abdomen and pelvis was performed following the standard protocol without IV contrast.  COMPARISON:  None.  FINDINGS: Lower chest: No acute abnormality.    Hepatobiliary: No focal hepatic abnormality. Gallbladder unremarkable.  Pancreas: No focal abnormality or ductal dilatation.  Spleen: No focal abnormality.  Normal size.  Adrenals/Urinary Tract: 7 mm proximal right ureteral stone with mild right hydronephrosis. No hydronephrosis or stones on the left. Probable parapelvic cysts on the left. Adrenal glands and urinary bladder unremarkable.  Stomach/Bowel: Stomach, large and small bowel grossly unremarkable. Appendix normal.  Vascular/Lymphatic: No evidence of aneurysm or adenopathy.  Reproductive: Uterus and adnexa unremarkable.  No mass.  Other: No free fluid or free air.  Musculoskeletal: No acute bony abnormality.  IMPRESSION: 7 mm proximal right ureteral stone with mild right hydronephrosis.   Electronically Signed By: Kevin  Dover M.D. On: 12/17/2020 12:56   Assessment & Plan:    1. Nephrolithiasis -We discussed the management of kidney stones. These options include observation, ureteroscopy, shockwave lithotripsy (ESWL) and percutaneous nephrolithotomy (PCNL). We discussed which options are relevant to the patient's stone(s). We discussed the natural history of kidney stones as well as the complications of untreated stones and the impact on quality of life without treatment as well as with each of the above listed treatments. We also discussed the  efficacy of each treatment in its ability to clear the stone burden. With any of these management options I discussed the signs and symptoms of infection and the need for emergent treatment should these be experienced. For each option we discussed the ability of each procedure to clear the patient of their stone burden.   For observation I described the risks which include but are not limited to silent renal damage, life-threatening infection, need for emergent surgery, failure to pass stone and pain.   For ureteroscopy I described the risks which include bleeding, infection, damage to contiguous structures, positioning injury, ureteral stricture, ureteral avulsion, ureteral injury, need for prolonged ureteral stent, inability to perform ureteroscopy, need for an interval procedure, inability to clear stone burden, stent discomfort/pain, heart attack, stroke, pulmonary embolus and the inherent risks with general anesthesia.   For shockwave lithotripsy I described the risks which include arrhythmia, kidney contusion, kidney hemorrhage, need for transfusion, pain, inability to adequately break up stone, inability to pass stone fragments, Steinstrasse, infection associated with obstructing stones, need for alternate surgical procedure, need for repeat shockwave lithotripsy, MI, CVA, PE and the inherent risks with anesthesia/conscious sedation.   For PCNL I described the risks including positioning injury, pneumothorax, hydrothorax, need for chest tube, inability to clear stone burden, renal laceration, arterial venous fistula or malformation, need for embolization of kidney, loss of kidney or renal function, need for repeat procedure, need for prolonged nephrostomy tube, ureteral avulsion, MI, CVA, PE and the inherent risks of general anesthesia.   - The patient would like to proceed with Right ESWL - Urinalysis, Routine w reflex microscopic   No follow-ups on file.  Consepcion Utt, MD  [facility] [place]   

## 2021-04-03 NOTE — Progress Notes (Signed)
04/03/2021 2:41 PM   Joanne Frazier 01-05-1966 [id number]  Referring provider: Practice, [facility] [address] [place],  [place] [address]  nephrolithiasis   HPI: Joanne Frazier is a 55yo here for evaluation of nephrolithiasis. Starting 10/19 she developed right flank pain and presented to the ER. She was diagnosed with a 32mm right proximal ureteral calculus. This is her 4th stone event. Her last stone event was in July 2022.  The pain was sharp, constant, severe and nonraditing. She is currently taking oxycodone which improves her pain. No nausea or vomiting. She has had intermittent gross hematuria since the 19th. No LUTS and no dysuria.    PMH: Past Medical History:  Diagnosis Date   Depression    Headache(784.0)    Hearing impairment    History of anemia    Hypertension    Irritable bowel syndrome    Missed period 08/19/2013   Obesity    Thyroid disease     Surgical History: No past surgical history on file.  Home Medications:  Allergies as of 04/03/2021       Reactions   Bupropion Other (See Comments)   Depression   Losartan Other (See Comments)   Jitteriness    Metoprolol Nausea And Vomiting   Naproxen Sodium Other (See Comments)   Funny feeling   Sulfa Antibiotics Other (See Comments)   Unknown.    Prednisone Other (See Comments), Palpitations   Shakiness        Medication List        Accurate as of April 03, 2021  2:41 PM. If you have any questions, ask your nurse or doctor.          amLODipine 5 MG tablet Commonly known as: NORVASC SMARTSIG:1 Tablet(s) By Mouth Every Evening   citalopram 40 MG tablet Commonly known as: CeleXA Take 1 tablet (40 mg total) by mouth daily.   DULoxetine 30 MG capsule Commonly known as: CYMBALTA 1 capsule   lamoTRIgine 25 MG tablet Commonly known as: LaMICtal Take 2 tablets (50 mg total) by mouth 2 (two) times daily.   ondansetron 4 MG disintegrating tablet Commonly known as: Zofran ODT 4mg  ODT q4  hours prn nausea/vomit   oxyCODONE-acetaminophen 5-325 MG tablet Commonly known as: Percocet Take 1 tablet by mouth every 6 (six) hours as needed.   tamsulosin 0.4 MG Caps capsule Commonly known as: FLOMAX Take 1 capsule (0.4 mg total) by mouth daily.        Allergies:  Allergies  Allergen Reactions   Bupropion Other (See Comments)    Depression   Losartan Other (See Comments)    Jitteriness    Metoprolol Nausea And Vomiting   Naproxen Sodium Other (See Comments)    Funny feeling   Sulfa Antibiotics Other (See Comments)    Unknown.    Prednisone Other (See Comments) and Palpitations    Shakiness    Family History: Family History  Problem Relation Age of Onset   Depression Sister    Depression Brother    Depression Brother    Alcohol abuse Paternal Uncle    Cirrhosis Paternal Uncle    Depression Maternal Grandmother        "Nervous Breakdown"   Seizures Father    ADD / ADHD Daughter    ADD / ADHD Son    Cancer Maternal Grandfather        colon   Drug abuse Neg Hx    Dementia Neg Hx    Bipolar disorder Neg  Hx    Anxiety disorder Neg Hx    OCD Neg Hx    Paranoid behavior Neg Hx    Schizophrenia Neg Hx    Sexual abuse Neg Hx    Physical abuse Neg Hx     Social History:  reports that she has been smoking cigarettes. She has a 4.50 pack-year smoking history. She has never used smokeless tobacco. She reports that she does not drink alcohol and does not use drugs.  ROS: All other review of systems were reviewed and are negative except what is noted above in HPI  Physical Exam: BP 116/74   Pulse 89   Wt 238 lb (108 kg)   BMI 38.41 kg/m   Constitutional:  Alert and oriented, No acute distress. HEENT: [place] AT, moist mucus membranes.  Trachea midline, no masses. Cardiovascular: No clubbing, cyanosis, or edema. Respiratory: Normal respiratory effort, no increased work of breathing. GI: Abdomen is soft, nontender, nondistended, no abdominal masses GU: No CVA  tenderness.  Lymph: No cervical or inguinal lymphadenopathy. Skin: No rashes, bruises or suspicious lesions. Neurologic: Grossly intact, no focal deficits, moving all 4 extremities. Psychiatric: Normal mood and affect.  Laboratory Data: Lab Results  Component Value Date   WBC 12.6 (H) 03/29/2021   HGB 13.0 03/29/2021   HCT 39.1 03/29/2021   MCV 94.2 03/29/2021   PLT 221 03/29/2021    Lab Results  Component Value Date   CREATININE 1.07 (H) 03/29/2021    No results found for: PSA  No results found for: TESTOSTERONE  No results found for: HGBA1C  Urinalysis    Component Value Date/Time   COLORURINE YELLOW 03/29/2021 0800   APPEARANCEUR CLEAR 03/29/2021 0800   LABSPEC 1.019 03/29/2021 0800   PHURINE 9.0 (H) 03/29/2021 0800   GLUCOSEU NEGATIVE 03/29/2021 0800   HGBUR NEGATIVE 03/29/2021 0800   BILIRUBINUR NEGATIVE 03/29/2021 0800   KETONESUR NEGATIVE 03/29/2021 0800   PROTEINUR NEGATIVE 03/29/2021 0800   UROBILINOGEN 0.2 10/18/2008 2050   NITRITE NEGATIVE 03/29/2021 0800   LEUKOCYTESUR NEGATIVE 03/29/2021 0800    Lab Results  Component Value Date   BACTERIA NONE SEEN 12/17/2020    Pertinent Imaging: CT 03/29/2021: Images reviewed and discussed with the patient Results for orders placed during the hospital encounter of 03/31/21  DG Abdomen 1 View  Narrative CLINICAL DATA:  Right flank pain, known kidney stone.  EXAM: ABDOMEN - 1 VIEW  COMPARISON:  Correlation made with recent CT abdomen  FINDINGS: Right ureteral calculus on the CT is not identified. Overlying bowel gas is present. No additional calculi identified. Bowel gas pattern is unremarkable.  IMPRESSION: Right ureteral calculus on prior CT is not identified and may be obscured by overlying bowel gas.   Electronically Signed By: Guadlupe Spanish M.D. On: 03/31/2021 13:56  No results found for this or any previous visit.  No results found for this or any previous visit.  No results found  for this or any previous visit.  No results found for this or any previous visit.  No results found for this or any previous visit.  No results found for this or any previous visit.  Results for orders placed during the hospital encounter of 12/17/20  CT Renal Stone Study  Narrative CLINICAL DATA:  Right flank pain  EXAM: CT ABDOMEN AND PELVIS WITHOUT CONTRAST  TECHNIQUE: Multidetector CT imaging of the abdomen and pelvis was performed following the standard protocol without IV contrast.  COMPARISON:  None.  FINDINGS: Lower chest: No acute abnormality.  Hepatobiliary: No focal hepatic abnormality. Gallbladder unremarkable.  Pancreas: No focal abnormality or ductal dilatation.  Spleen: No focal abnormality.  Normal size.  Adrenals/Urinary Tract: 7 mm proximal right ureteral stone with mild right hydronephrosis. No hydronephrosis or stones on the left. Probable parapelvic cysts on the left. Adrenal glands and urinary bladder unremarkable.  Stomach/Bowel: Stomach, large and small bowel grossly unremarkable. Appendix normal.  Vascular/Lymphatic: No evidence of aneurysm or adenopathy.  Reproductive: Uterus and adnexa unremarkable.  No mass.  Other: No free fluid or free air.  Musculoskeletal: No acute bony abnormality.  IMPRESSION: 7 mm proximal right ureteral stone with mild right hydronephrosis.   Electronically Signed By: Charlett Nose M.D. On: 12/17/2020 12:56   Assessment & Plan:    1. Nephrolithiasis -We discussed the management of kidney stones. These options include observation, ureteroscopy, shockwave lithotripsy (ESWL) and percutaneous nephrolithotomy (PCNL). We discussed which options are relevant to the patient's stone(s). We discussed the natural history of kidney stones as well as the complications of untreated stones and the impact on quality of life without treatment as well as with each of the above listed treatments. We also discussed the  efficacy of each treatment in its ability to clear the stone burden. With any of these management options I discussed the signs and symptoms of infection and the need for emergent treatment should these be experienced. For each option we discussed the ability of each procedure to clear the patient of their stone burden.   For observation I described the risks which include but are not limited to silent renal damage, life-threatening infection, need for emergent surgery, failure to pass stone and pain.   For ureteroscopy I described the risks which include bleeding, infection, damage to contiguous structures, positioning injury, ureteral stricture, ureteral avulsion, ureteral injury, need for prolonged ureteral stent, inability to perform ureteroscopy, need for an interval procedure, inability to clear stone burden, stent discomfort/pain, heart attack, stroke, pulmonary embolus and the inherent risks with general anesthesia.   For shockwave lithotripsy I described the risks which include arrhythmia, kidney contusion, kidney hemorrhage, need for transfusion, pain, inability to adequately break up stone, inability to pass stone fragments, Steinstrasse, infection associated with obstructing stones, need for alternate surgical procedure, need for repeat shockwave lithotripsy, MI, CVA, PE and the inherent risks with anesthesia/conscious sedation.   For PCNL I described the risks including positioning injury, pneumothorax, hydrothorax, need for chest tube, inability to clear stone burden, renal laceration, arterial venous fistula or malformation, need for embolization of kidney, loss of kidney or renal function, need for repeat procedure, need for prolonged nephrostomy tube, ureteral avulsion, MI, CVA, PE and the inherent risks of general anesthesia.   - The patient would like to proceed with Right ESWL - Urinalysis, Routine w reflex microscopic   No follow-ups on file.  Wilkie Aye, MD  [facility]  Urology [place]

## 2021-04-03 NOTE — Patient Instructions (Signed)
Lithotripsy ?Lithotripsy is a treatment that can help break up kidney stones that are too large to pass on their own. This is a nonsurgical procedure that crushes a kidney stone with shock waves. These shock waves pass through your body and focus on the kidney stone. They cause the kidney stone to break up into smaller pieces while it is still in the urinary tract. The smaller pieces of stone can pass more easily out of your body in the urine. ?Tell a health care provider about: ?Any allergies you have. ?All medicines you are taking, including vitamins, herbs, eye drops, creams, and over-the-counter medicines. ?Any problems you or family members have had with anesthetic medicines. ?Any blood disorders you have. ?Any surgeries you have had. ?Any medical conditions you have. ?Whether you are pregnant or may be pregnant. ?What are the risks? ?Generally, this is a safe procedure. However, problems may occur, including: ?Infection. ?Bleeding from the kidney. ?Bruising of the kidney or skin. ?Scarring of the kidney, which can lead to: ?Increased blood pressure. ?Poor kidney function. ?Return (recurrence) of kidney stones. ?Damage to other structures or organs, such as the liver, colon, spleen, or pancreas. ?Blockage (obstruction) of the tube that carries urine from the kidney to the bladder (ureter). ?Failure of the kidney stone to break into pieces (fragments). ?What happens before the procedure? ?Staying hydrated ?Follow instructions from your health care provider about hydration, which may include: ?Up to 2 hours before the procedure - you may continue to drink clear liquids, such as water, clear fruit juice, black coffee, and plain tea. ?Eating and drinking restrictions ?Follow instructions from your health care provider about eating and drinking, which may include: ?8 hours before the procedure - stop eating heavy meals or foods, such as meat, fried foods, or fatty foods. ?6 hours before the procedure - stop eating  light meals or foods, such as toast or cereal. ?6 hours before the procedure - stop drinking milk or drinks that contain milk. ?2 hours before the procedure - stop drinking clear liquids. ?Medicines ?Ask your health care provider about: ?Changing or stopping your regular medicines. This is especially important if you are taking diabetes medicines or blood thinners. ?Taking medicines such as aspirin and ibuprofen. These medicines can thin your blood. Do not take these medicines unless your health care provider tells you to take them. ?Taking over-the-counter medicines, vitamins, herbs, and supplements. ?Tests ?You may have tests, such as: ?Blood tests. ?Urine tests. ?Imaging tests, such as a CT scan. ?General instructions ?Plan to have someone take you home from the hospital or clinic. ?If you will be going home right after the procedure, plan to have someone with you for 24 hours. ?Ask your health care provider what steps will be taken to help prevent infection. These may include washing skin with a germ-killing soap. ?What happens during the procedure? ? ?An IV will be inserted into one of your veins. ?You will be given one or more of the following: ?A medicine to help you relax (sedative). ?A medicine to make you fall asleep (general anesthetic). ?A water-filled cushion may be placed behind your kidney or on your abdomen. In some cases, you may be placed in a tub of lukewarm water. ?Your body will be positioned in a way that makes it easy to target the kidney stone. ?An X-ray or ultrasound exam will be done to locate your stone. ?Shock waves will be aimed at the stone. If you are awake, you may feel a tapping sensation as   the shock waves pass through your body. ?A flexible tube with holes in it (stent) may be placed in the ureter. This will help keep urine flowing from the kidney if the fragments of the stone have been blocking the ureter. ?The procedure may vary among health care providers and hospitals. ?What  happens after the procedure? ?You may have an X-ray to see whether the procedure was able to break up the kidney stone and how much of the stone has passed. If large stone fragments remain after treatment, you may need to have a second procedure at a later time. ?Your blood pressure, heart rate, breathing rate, and blood oxygen level will be monitored until you leave the hospital or clinic. ?You may be given antibiotics or pain medicine as needed. ?If a stent was placed in your ureter during surgery, it may stay in place for a few weeks. ?You may need to strain your urine to collect pieces of the kidney stone for testing. ?You will need to drink plenty of water. ?If you were given a sedative during the procedure, it can affect you for several hours. Do not drive or operate machinery until your health care provider says that it is safe. ?Summary ?Lithotripsy is a treatment that can help break up kidney stones that are too large to pass on their own. ?Lithotripsy is a nonsurgical procedure that crushes a kidney stone with shock waves. ?Generally, this is a safe procedure. However, problems may occur, including damage to the kidney or other organs, infection, or obstruction of the tube that carries urine from the kidney to the bladder (ureter). ?You may have a stent placed in your ureter to help drain your urine. This stent may stay in place for a few weeks. ?After the procedure, you will need to drink plenty of water. You may be asked to strain your urine to collect pieces of the kidney stone for testing. ?This information is not intended to replace advice given to you by your health care provider. Make sure you discuss any questions you have with your health care provider. ?Document Revised: 03/10/2019 Document Reviewed: 03/11/2019 ?Elsevier Patient Education ? 2022 [employer] ? ?

## 2021-04-06 ENCOUNTER — Other Ambulatory Visit: Payer: Self-pay

## 2021-04-06 DIAGNOSIS — N200 Calculus of kidney: Secondary | ICD-10-CM

## 2021-04-10 ENCOUNTER — Other Ambulatory Visit: Payer: Self-pay

## 2021-04-10 ENCOUNTER — Encounter (HOSPITAL_COMMUNITY): Payer: Self-pay

## 2021-04-10 ENCOUNTER — Encounter (HOSPITAL_COMMUNITY)
Admission: RE | Admit: 2021-04-10 | Discharge: 2021-04-10 | Disposition: A | Discharge status: Home / Self Care | Payer: BC Managed Care – PPO | Source: Ambulatory Visit | Attending: Urology | Admitting: Urology

## 2021-04-11 ENCOUNTER — Ambulatory Visit (HOSPITAL_COMMUNITY)
Admission: RE | Admit: 2021-04-11 | Discharge: 2021-04-11 | Disposition: A | Discharge status: Home / Self Care | Payer: BC Managed Care – PPO | Source: Ambulatory Visit | Attending: Urology | Admitting: Urology

## 2021-04-11 ENCOUNTER — Ambulatory Visit (HOSPITAL_COMMUNITY): Payer: BC Managed Care – PPO

## 2021-04-11 ENCOUNTER — Encounter (HOSPITAL_COMMUNITY): Payer: Self-pay | Admitting: Urology

## 2021-04-11 ENCOUNTER — Encounter (HOSPITAL_COMMUNITY)
Admission: RE | Disposition: A | Discharge status: Home / Self Care | Payer: Self-pay | Source: Ambulatory Visit | Attending: Urology

## 2021-04-11 DIAGNOSIS — I10 Essential (primary) hypertension: Secondary | ICD-10-CM | POA: Insufficient documentation

## 2021-04-11 DIAGNOSIS — F17210 Nicotine dependence, cigarettes, uncomplicated: Secondary | ICD-10-CM | POA: Insufficient documentation

## 2021-04-11 DIAGNOSIS — Z888 Allergy status to other drugs, medicaments and biological substances status: Secondary | ICD-10-CM | POA: Insufficient documentation

## 2021-04-11 DIAGNOSIS — F32A Depression, unspecified: Secondary | ICD-10-CM | POA: Diagnosis not present

## 2021-04-11 DIAGNOSIS — Z6838 Body mass index (BMI) 38.0-38.9, adult: Secondary | ICD-10-CM | POA: Insufficient documentation

## 2021-04-11 DIAGNOSIS — F17200 Nicotine dependence, unspecified, uncomplicated: Secondary | ICD-10-CM | POA: Diagnosis not present

## 2021-04-11 DIAGNOSIS — Z79899 Other long term (current) drug therapy: Secondary | ICD-10-CM | POA: Diagnosis not present

## 2021-04-11 DIAGNOSIS — E669 Obesity, unspecified: Secondary | ICD-10-CM | POA: Diagnosis not present

## 2021-04-11 DIAGNOSIS — Z886 Allergy status to analgesic agent status: Secondary | ICD-10-CM | POA: Insufficient documentation

## 2021-04-11 DIAGNOSIS — Z882 Allergy status to sulfonamides status: Secondary | ICD-10-CM | POA: Diagnosis not present

## 2021-04-11 DIAGNOSIS — N201 Calculus of ureter: Secondary | ICD-10-CM | POA: Insufficient documentation

## 2021-04-11 HISTORY — PX: EXTRACORPOREAL SHOCK WAVE LITHOTRIPSY: SHX1557

## 2021-04-11 SURGERY — LITHOTRIPSY, ESWL
Anesthesia: General | Laterality: Right

## 2021-04-11 MED ORDER — DIAZEPAM 5 MG PO TABS
10.0000 mg | ORAL_TABLET | Freq: Once | ORAL | Status: AC
  Administered 2021-04-11: 10 mg via ORAL

## 2021-04-11 MED ORDER — DIPHENHYDRAMINE HCL 25 MG PO CAPS
ORAL_CAPSULE | ORAL | Status: AC
  Filled 2021-04-11: qty 1

## 2021-04-11 MED ORDER — SODIUM CHLORIDE 0.9 % IV SOLN
INTRAVENOUS | Status: DC
  Administered 2021-04-11: 1000 mL via INTRAVENOUS

## 2021-04-11 MED ORDER — DIPHENHYDRAMINE HCL 25 MG PO CAPS
25.0000 mg | ORAL_CAPSULE | ORAL | Status: AC
  Administered 2021-04-11: 25 mg via ORAL

## 2021-04-11 MED ORDER — TAMSULOSIN HCL 0.4 MG PO CAPS: 0.4000 mg | ORAL_CAPSULE | Freq: Every day | ORAL | 0 refills | Status: DC

## 2021-04-11 MED ORDER — DIAZEPAM 5 MG PO TABS
ORAL_TABLET | ORAL | Status: AC
  Filled 2021-04-11: qty 2

## 2021-04-11 MED ORDER — OXYCODONE-ACETAMINOPHEN 5-325 MG PO TABS: 1.0000 | ORAL_TABLET | ORAL | 0 refills | Status: DC | PRN

## 2021-04-11 NOTE — Interval H&P Note (Signed)
History and Physical Interval Note:  04/11/2021 9:06 AM  Joanne Frazier  has presented today for surgery, with the diagnosis of right ureteral calculus.  The various methods of treatment have been discussed with the patient and family. After consideration of risks, benefits and other options for treatment, the patient has consented to  Procedure(s): EXTRACORPOREAL SHOCK WAVE LITHOTRIPSY (ESWL) (Right) as a surgical intervention.  The patient's history has been reviewed, patient examined, no change in status, stable for surgery.  I have reviewed the patient's chart and labs.  Questions were answered to the patient's satisfaction.     Wilkie Aye

## 2021-04-12 ENCOUNTER — Encounter (HOSPITAL_COMMUNITY): Payer: Self-pay | Admitting: Urology

## 2021-04-17 ENCOUNTER — Telehealth: Payer: Self-pay | Admitting: Urology

## 2021-04-17 NOTE — Telephone Encounter (Signed)
Patient called concerned that last week she had shock wave lithotripsy w/Dr. Ronne Binning & although she has zero pain, she is extremely tired / lethargic which worries her since she is due to return to work today but works around [employer] account [employer].  Please call back to discuss & ease her concerns about how she is feeling.  At this time, she is no longer taking pain medication, as there is no residual pain + she could not tolerate the s/e of constipation.  Thank you

## 2021-04-17 NOTE — Telephone Encounter (Signed)
Patient asked for work note to be faxed for being out this week. Work note faxed per patient request.

## 2021-04-17 NOTE — Telephone Encounter (Signed)
Fax number  [phone]  Patient called with symptoms with extreme fatigue and headache. No urinary symptoms or urinary pain.  Patient encouraged to reach out to her PCP regarding her fatigue and headache to be checked for covid. Patient voiced understanding.

## 2021-04-18 ENCOUNTER — Telehealth: Payer: Self-pay

## 2021-04-18 NOTE — Telephone Encounter (Signed)
Pt called back  said that she was feeling better that she wanted to return to work on tomorrow, that she need a letter fax to her job stated that she need to be out 04/11/21-04/18/21 and return to work on the 04/19/21. Confirm this dates with the patient before ending the call and pt would like to have this fax to [phone].

## 2021-04-26 ENCOUNTER — Ambulatory Visit: Payer: BC Managed Care – PPO | Admitting: Urology

## 2021-06-19 ENCOUNTER — Ambulatory Visit (HOSPITAL_COMMUNITY)
Admission: RE | Admit: 2021-06-19 | Discharge: 2021-06-19 | Disposition: A | Discharge status: Home / Self Care | Payer: BC Managed Care – PPO | Source: Ambulatory Visit | Attending: Urology | Admitting: Urology

## 2021-06-19 ENCOUNTER — Encounter: Payer: Self-pay | Admitting: Urology

## 2021-06-19 ENCOUNTER — Ambulatory Visit: Payer: BC Managed Care – PPO | Admitting: Urology

## 2021-06-19 ENCOUNTER — Other Ambulatory Visit: Payer: Self-pay

## 2021-06-19 VITALS — BP 120/79 | HR 103

## 2021-06-19 DIAGNOSIS — N200 Calculus of kidney: Secondary | ICD-10-CM | POA: Diagnosis present

## 2021-06-19 LAB — URINALYSIS, ROUTINE W REFLEX MICROSCOPIC
Bilirubin, UA: NEGATIVE
Glucose, UA: NEGATIVE
Ketones, UA: NEGATIVE
Leukocytes,UA: NEGATIVE
Nitrite, UA: NEGATIVE
Protein,UA: NEGATIVE
RBC, UA: NEGATIVE
Specific Gravity, UA: 1.025 (ref 1.005–1.030)
Urobilinogen, Ur: 0.2 mg/dL (ref 0.2–1.0)
pH, UA: 5.5 (ref 5.0–7.5)

## 2021-06-19 NOTE — Progress Notes (Signed)
Urological Symptom Review  Patient is experiencing the following symptoms: Get up at night to urinate   Review of Systems  Gastrointestinal (upper)  : Negative for upper GI symptoms  Gastrointestinal (lower) : Negative for lower GI symptoms  Constitutional : Negative for symptoms  Skin: Negative for skin symptoms  Eyes: Negative for eye symptoms  Ear/Nose/Throat : Sinus problems  Hematologic/Lymphatic: Negative for Hematologic/Lymphatic symptoms  Cardiovascular : Negative for cardiovascular symptoms  Respiratory : Negative for respiratory symptoms  Endocrine: Negative for endocrine symptoms  Musculoskeletal: Back pain Joint pain  Neurological: Negative for neurological symptoms  Psychologic: Depression Anxiety

## 2021-06-19 NOTE — Patient Instructions (Signed)
Dietary Guidelines to Help Prevent Kidney Stones Kidney stones are deposits of minerals and salts that form inside your kidneys. Your risk of developing kidney stones may be greater depending on your diet, your lifestyle, the medicines you take, and whether you have certain medical conditions. Most people can lower their chances of developing kidney stones by following the instructions below. Your dietitian may give you more specific instructions depending on your overall health and the type of kidney stones you tend to develop. What are tips for following this plan? Reading food labels  Choose foods with "no salt added" or "low-salt" labels. Limit your salt (sodium) intake to less than 1,500 mg a day. Choose foods with calcium for each meal and snack. Try to eat about 300 mg of calcium at each meal. Foods that contain 200-500 mg of calcium a serving include: 8 oz (237 mL) of milk, calcium-fortifiednon-dairy milk, and calcium-fortifiedfruit juice. Calcium-fortified means that calcium has been added to these drinks. 8 oz (237 mL) of kefir, yogurt, and soy yogurt. 4 oz (114 g) of tofu. 1 oz (28 g) of cheese. 1 cup (150 g) of dried figs. 1 cup (91 g) of cooked broccoli. One 3 oz (85 g) can of sardines or mackerel. Most people need 1,000-1,500 mg of calcium a day. Talk to your dietitian about how much calcium is recommended for you. Shopping Buy plenty of fresh fruits and vegetables. Most people do not need to avoid fruits and vegetables, even if these foods contain nutrients that may contribute to kidney stones. When shopping for convenience foods, choose: Whole pieces of fruit. Pre-made salads with dressing on the side. Low-fat fruit and yogurt smoothies. Avoid buying frozen meals or prepared deli foods. These can be high in sodium. Look for foods with live cultures, such as yogurt and kefir. Choose high-fiber grains, such as whole-wheat breads, oat bran, and wheat cereals. Cooking Do not add  salt to food when cooking. Place a salt shaker on the table and allow each person to add his or her own salt to taste. Use vegetable protein, such as beans, textured vegetable protein (TVP), or tofu, instead of meat in pasta, casseroles, and soups. Meal planning Eat less salt, if told by your dietitian. To do this: Avoid eating processed or pre-made food. Avoid eating fast food. Eat less animal protein, including cheese, meat, poultry, or fish, if told by your dietitian. To do this: Limit the number of times you have meat, poultry, fish, or cheese each week. Eat a diet free of meat at least 2 days a week. Eat only one serving each day of meat, poultry, fish, or seafood. When you prepare animal protein, cut pieces into small portion sizes. For most meat and fish, one serving is about the size of the palm of your hand. Eat at least five servings of fresh fruits and vegetables each day. To do this: Keep fruits and vegetables on hand for snacks. Eat one piece of fruit or a handful of berries with breakfast. Have a salad and fruit at lunch. Have two kinds of vegetables at dinner. Limit foods that are high in a substance called oxalate. These include: Spinach (cooked), rhubarb, beets, sweet potatoes, and Swiss chard. Peanuts. Potato chips, french fries, and baked potatoes with skin on. Nuts and nut products. Chocolate. If you regularly take a diuretic medicine, make sure to eat at least 1 or 2 servings of fruits or vegetables that are high in potassium each day. These include: Avocado. Banana. Orange, prune,   carrot, or tomato juice. Baked potato. Cabbage. Beans and split peas. Lifestyle  Drink enough fluid to keep your urine pale yellow. This is the most important thing you can do. Spread your fluid intake throughout the day. If you drink alcohol: Limit how much you use to: 0-1 drink a day for women who are not pregnant. 0-2 drinks a day for men. Be aware of how much alcohol is in your  drink. In the [place] one drink equals one 12 oz bottle of beer (355 mL), one 5 oz glass of wine (148 mL), or one 1 oz glass of hard liquor (44 mL). Lose weight if told by your health care provider. Work with your dietitian to find an eating plan and weight loss strategies that work best for you. General information Talk to your health care provider and dietitian about taking daily supplements. You may be told the following depending on your health and the cause of your kidney stones: Not to take supplements with vitamin C. To take a calcium supplement. To take a daily probiotic supplement. To take other supplements such as magnesium, fish oil, or vitamin B6. Take over-the-counter and prescription medicines only as told by your health care provider. These include supplements. What foods should I limit? Limit your intake of the following foods, or eat them as told by your dietitian. Vegetables Spinach. Rhubarb. Beets. Canned vegetables. Pickles. Olives. Baked potatoes with skin. Grains Wheat bran. Baked goods. Salted crackers. Cereals high in sugar. Meats and other proteins Nuts. Nut butters. Large portions of meat, poultry, or fish. Salted, precooked, or cured meats, such as sausages, meat loaves, and hot dogs. Dairy Cheese. Beverages Regular soft drinks. Regular vegetable juice. Seasonings and condiments Seasoning blends with salt. Salad dressings. Soy sauce. Ketchup. Barbecue sauce. Other foods Canned soups. Canned pasta sauce. Casseroles. Pizza. Lasagna. Frozen meals. Potato chips. French fries. The items listed above may not be a complete list of foods and beverages you should limit. Contact a dietitian for more information. What foods should I avoid? Talk to your dietitian about specific foods you should avoid based on the type of kidney stones you have and your overall health. Fruits Grapefruit. The item listed above may not be a complete list of foods and beverages you should  avoid. Contact a dietitian for more information. Summary Kidney stones are deposits of minerals and salts that form inside your kidneys. You can lower your risk of kidney stones by making changes to your diet. The most important thing you can do is drink enough fluid. Drink enough fluid to keep your urine pale yellow. Talk to your dietitian about how much calcium you should have each day, and eat less salt and animal protein as told by your dietitian. This information is not intended to replace advice given to you by your health care provider. Make sure you discuss any questions you have with your health care provider. Document Revised: 05/21/2019 Document Reviewed: 05/21/2019 Elsevier Patient Education  2022 Elsevier Inc.  

## 2021-06-19 NOTE — Progress Notes (Signed)
06/19/2021 3:16 PM   Joanne Frazier 1966-01-29 [id number]  Referring provider: [facility], [facility] [address] [place],  [place] [address]  Followup nephrolithiasis  HPI: Ms Joanne Frazier is a 55yo here for followup for nephrolithiasis. She underwent ESWL 04/11/2021 and passed numerous fragments. She denies nay flank pain. KUB from today shows no ureteral calculi. No significant LUTS.    PMH: Past Medical History:  Diagnosis Date   Depression    Headache(784.0)    Hearing impairment    History of anemia    Hypertension    Irritable bowel syndrome    Missed period 08/19/2013   Obesity    Thyroid disease     Surgical History: Past Surgical History:  Procedure Laterality Date   EXTRACORPOREAL SHOCK WAVE LITHOTRIPSY Right 04/11/2021   Procedure: EXTRACORPOREAL SHOCK WAVE LITHOTRIPSY (ESWL);  Surgeon: Malen Gauze, MD;  Location: [facility];  Service: Urology;  Laterality: Right;   WISDOM TOOTH EXTRACTION Bilateral     Home Medications:  Allergies as of 06/19/2021       Reactions   Bupropion Other (See Comments)   Depression   Losartan Other (See Comments)   Jitteriness    Metoprolol Nausea And Vomiting   Naproxen Sodium Other (See Comments)   Funny feeling   Sulfa Antibiotics Other (See Comments)   Unknown.    Prednisone Other (See Comments), Palpitations   Shakiness        Medication List        Accurate as of June 19, 2021  3:16 PM. If you have any questions, ask your nurse or doctor.          amLODipine 5 MG tablet Commonly known as: NORVASC SMARTSIG:1 Tablet(s) By Mouth Every Evening   DULoxetine 30 MG capsule Commonly known as: CYMBALTA 1 capsule   DULoxetine 60 MG capsule Commonly known as: CYMBALTA Take 60 mg by mouth daily.   ondansetron 4 MG disintegrating tablet Commonly known as: Zofran ODT 4mg  ODT q4 hours prn nausea/vomit   oxyCODONE-acetaminophen 5-325 MG tablet Commonly known as: Percocet Take 1 tablet by mouth every 4 (four)  hours as needed.   polyethylene glycol 17 g packet Commonly known as: MiraLax Take 17 g by mouth daily.   tamsulosin 0.4 MG Caps capsule Commonly known as: FLOMAX Take 1 capsule (0.4 mg total) by mouth daily.        Allergies:  Allergies  Allergen Reactions   Bupropion Other (See Comments)    Depression   Losartan Other (See Comments)    Jitteriness    Metoprolol Nausea And Vomiting   Naproxen Sodium Other (See Comments)    Funny feeling   Sulfa Antibiotics Other (See Comments)    Unknown.    Prednisone Other (See Comments) and Palpitations    Shakiness    Family History: Family History  Problem Relation Age of Onset   Depression Sister    Depression Brother    Depression Brother    Alcohol abuse Paternal Uncle    Cirrhosis Paternal Uncle    Depression Maternal Grandmother        "Nervous Breakdown"   Seizures Father    ADD / ADHD Daughter    ADD / ADHD Son    Cancer Maternal Grandfather        colon   Drug abuse Neg Hx    Dementia Neg Hx    Bipolar disorder Neg Hx    Anxiety disorder Neg Hx    OCD Neg Hx  Paranoid behavior Neg Hx    Schizophrenia Neg Hx    Sexual abuse Neg Hx    Physical abuse Neg Hx     Social History:  reports that she has been smoking cigarettes. She has a 4.50 pack-year smoking history. She has never used smokeless tobacco. She reports that she does not drink alcohol and does not use drugs.  ROS: All other review of systems were reviewed and are negative except what is noted above in HPI  Physical Exam: BP 120/79    Pulse (!) 103    LMP 06/26/2013   Constitutional:  Alert and oriented, No acute distress. HEENT: [name], moist mucus membranes.  Trachea midline, no masses. Cardiovascular: No clubbing, cyanosis, or edema. Respiratory: Normal respiratory effort, no increased work of breathing. GI: Abdomen is soft, nontender, nondistended, no abdominal masses GU: No CVA tenderness.  Lymph: No cervical or inguinal  lymphadenopathy. Skin: No rashes, bruises or suspicious lesions. Neurologic: Grossly intact, no focal deficits, moving all 4 extremities. Psychiatric: Normal mood and affect.  Laboratory Data: Lab Results  Component Value Date   WBC 12.6 (H) 03/29/2021   HGB 13.0 03/29/2021   HCT 39.1 03/29/2021   MCV 94.2 03/29/2021   PLT 221 03/29/2021    Lab Results  Component Value Date   CREATININE 1.07 (H) 03/29/2021    No results found for: PSA  No results found for: TESTOSTERONE  No results found for: HGBA1C  Urinalysis    Component Value Date/Time   COLORURINE YELLOW 03/29/2021 0800   APPEARANCEUR Clear 04/03/2021 1423   LABSPEC 1.019 03/29/2021 0800   PHURINE 9.0 (H) 03/29/2021 0800   GLUCOSEU Negative 04/03/2021 1423   HGBUR NEGATIVE 03/29/2021 0800   BILIRUBINUR Negative 04/03/2021 1423   KETONESUR NEGATIVE 03/29/2021 0800   PROTEINUR Negative 04/03/2021 1423   PROTEINUR NEGATIVE 03/29/2021 0800   UROBILINOGEN 0.2 10/18/2008 2050   NITRITE Negative 04/03/2021 1423   NITRITE NEGATIVE 03/29/2021 0800   LEUKOCYTESUR Negative 04/03/2021 1423   LEUKOCYTESUR NEGATIVE 03/29/2021 0800    Lab Results  Component Value Date   LABMICR See below: 04/03/2021   WBCUA 0-5 04/03/2021   LABEPIT 0-10 04/03/2021   MUCUS Present 04/03/2021   BACTERIA Few 04/03/2021    Pertinent Imaging: KUb today: Images reviewed and discussed with the patient  Results for orders placed during the hospital encounter of 04/11/21  DG Abd 1 View  Narrative CLINICAL DATA:  Kidney stones  EXAM: ABDOMEN - 1 VIEW  COMPARISON:  KUB 03/31/2021  FINDINGS: Renal shadows are obscured by bowel content. No definite nephrolithiasis appreciated.  Nonobstructive bowel gas pattern. Moderate amount of retained fecal material.  IMPRESSION: No definite nephrolithiasis visualized. Renal shadows obscured by bowel content.   Electronically Signed By: Jannifer Hick M.D. On: 04/11/2021 09:04  No  results found for this or any previous visit.  No results found for this or any previous visit.  No results found for this or any previous visit.  No results found for this or any previous visit.  No results found for this or any previous visit.  No results found for this or any previous visit.  Results for orders placed during the hospital encounter of 12/17/20  CT Renal Stone Study  Narrative CLINICAL DATA:  Right flank pain  EXAM: CT ABDOMEN AND PELVIS WITHOUT CONTRAST  TECHNIQUE: Multidetector CT imaging of the abdomen and pelvis was performed following the standard protocol without IV contrast.  COMPARISON:  None.  FINDINGS: Lower chest: No acute  abnormality.  Hepatobiliary: No focal hepatic abnormality. Gallbladder unremarkable.  Pancreas: No focal abnormality or ductal dilatation.  Spleen: No focal abnormality.  Normal size.  Adrenals/Urinary Tract: 7 mm proximal right ureteral stone with mild right hydronephrosis. No hydronephrosis or stones on the left. Probable parapelvic cysts on the left. Adrenal glands and urinary bladder unremarkable.  Stomach/Bowel: Stomach, large and small bowel grossly unremarkable. Appendix normal.  Vascular/Lymphatic: No evidence of aneurysm or adenopathy.  Reproductive: Uterus and adnexa unremarkable.  No mass.  Other: No free fluid or free air.  Musculoskeletal: No acute bony abnormality.  IMPRESSION: 7 mm proximal right ureteral stone with mild right hydronephrosis.   Electronically Signed By: Charlett NoseKevin  Dover M.D. On: 12/17/2020 12:56   Assessment & Plan:    1. Nephrolithiasis -Dietary handout given -RTC 3 months with renal US   No follow-ups on file.  Wilkie AyePatrick Hoda Hon, MD  [facility]

## 2021-06-23 LAB — CALCULI, WITH PHOTOGRAPH (CLINICAL LAB)
Calcium Oxalate Dihydrate: 20 %
Calcium Oxalate Monohydrate: 80 %
Weight Calculi: 25 mg

## 2021-09-12 ENCOUNTER — Ambulatory Visit (HOSPITAL_COMMUNITY)
Admission: RE | Admit: 2021-09-12 | Discharge: 2021-09-12 | Disposition: A | Discharge status: Home / Self Care | Payer: BC Managed Care – PPO | Source: Ambulatory Visit | Attending: Urology | Admitting: Urology

## 2021-09-12 DIAGNOSIS — N200 Calculus of kidney: Secondary | ICD-10-CM | POA: Diagnosis present

## 2021-09-13 ENCOUNTER — Encounter: Payer: Self-pay | Admitting: Urology

## 2021-09-13 ENCOUNTER — Ambulatory Visit (INDEPENDENT_AMBULATORY_CARE_PROVIDER_SITE_OTHER): Payer: BC Managed Care – PPO | Admitting: Urology

## 2021-09-13 VITALS — BP 113/74 | HR 118

## 2021-09-13 DIAGNOSIS — N200 Calculus of kidney: Secondary | ICD-10-CM | POA: Diagnosis not present

## 2021-09-13 LAB — URINALYSIS, ROUTINE W REFLEX MICROSCOPIC
Bilirubin, UA: NEGATIVE
Glucose, UA: NEGATIVE
Ketones, UA: NEGATIVE
Leukocytes,UA: NEGATIVE
Nitrite, UA: NEGATIVE
Protein,UA: NEGATIVE
RBC, UA: NEGATIVE
Specific Gravity, UA: >1.03 — ABNORMAL HIGH (ref 1.005–1.030)
Urobilinogen, Ur: 0.2 mg/dL (ref 0.2–1.0)
pH, UA: 5.5 (ref 5.0–7.5)

## 2021-09-13 NOTE — Progress Notes (Signed)
? ?09/13/2021 ?1:45 PM  ? ?Joanne Frazier ?09/02/1965 ?[id number] ? ?Referring provider: [facility] ?[address] ?[place],  [place] [address] ? ?Followup nephrolithiasis ? ? ?HPI: ?Joanne Frazier is a 56yo here for followup for nephrolithiasis. She has been doing well since ESWL. She has not passed any fragments in more than a month. She denies any flank pain. She denies any LUTS. Renal US from 09/12/2021 shows no hydronephrosis and no calculi. No other complaints today ? ? ?PMH: ?Past Medical History:  ?Diagnosis Date  ? Depression   ? Headache(784.0)   ? Hearing impairment   ? History of anemia   ? Hypertension   ? Irritable bowel syndrome   ? Missed period 08/19/2013  ? Obesity   ? Thyroid disease   ? ? ?Surgical History: ?Past Surgical History:  ?Procedure Laterality Date  ? EXTRACORPOREAL SHOCK WAVE LITHOTRIPSY Right 04/11/2021  ? Procedure: EXTRACORPOREAL SHOCK WAVE LITHOTRIPSY (ESWL);  Surgeon: Cleon Gustin, MD;  Location: [facility];  Service: Urology;  Laterality: Right;  ? WISDOM TOOTH EXTRACTION Bilateral   ? ? ?Home Medications:  ?Allergies as of 09/13/2021   ? ?   Reactions  ? Bupropion Other (See Comments)  ? Depression  ? Losartan Other (See Comments)  ? Jitteriness   ? Metoprolol Nausea And Vomiting  ? Naproxen Sodium Other (See Comments)  ? Funny feeling  ? Sulfa Antibiotics Other (See Comments)  ? Unknown.   ? Prednisone Other (See Comments), Palpitations  ? Shakiness  ? ?  ? ?  ?Medication List  ?  ? ?  ? Accurate as of September 13, 2021  1:45 PM. If you have any questions, ask your nurse or doctor.  ?  ?  ? ?  ? ?amLODipine 5 MG tablet ?Commonly known as: NORVASC ?SMARTSIG:1 Tablet(s) By Mouth Every Evening ?  ?DULoxetine 30 MG capsule ?Commonly known as: CYMBALTA ?1 capsule ?  ?DULoxetine 60 MG capsule ?Commonly known as: CYMBALTA ?Take 60 mg by mouth daily. ?  ?meloxicam 15 MG tablet ?Commonly known as: MOBIC ?Take 15 mg by mouth every morning. ?  ?ondansetron 4 MG disintegrating tablet ?Commonly  known as: Zofran ODT ?4mg  ODT q4 hours prn nausea/vomit ?  ?oxyCODONE-acetaminophen 5-325 MG tablet ?Commonly known as: Percocet ?Take 1 tablet by mouth every 4 (four) hours as needed. ?  ?polyethylene glycol 17 g packet ?Commonly known as: MiraLax ?Take 17 g by mouth daily. ?  ?tamsulosin 0.4 MG Caps capsule ?Commonly known as: FLOMAX ?Take 1 capsule (0.4 mg total) by mouth daily. ?  ? ?  ? ? ?Allergies:  ?Allergies  ?Allergen Reactions  ? Bupropion Other (See Comments)  ?  Depression  ? Losartan Other (See Comments)  ?  Jitteriness   ? Metoprolol Nausea And Vomiting  ? Naproxen Sodium Other (See Comments)  ?  Funny feeling  ? Sulfa Antibiotics Other (See Comments)  ?  Unknown.   ? Prednisone Other (See Comments) and Palpitations  ?  Shakiness  ? ? ?Family History: ?Family History  ?Problem Relation Age of Onset  ? Depression Sister   ? Depression Brother   ? Depression Brother   ? Alcohol abuse Paternal Uncle   ? Cirrhosis Paternal Uncle   ? Depression Maternal Grandmother   ?     "Nervous Breakdown"  ? Seizures Father   ? ADD / ADHD Daughter   ? ADD / ADHD Son   ? Cancer Maternal Grandfather   ?  colon  ? Drug abuse Neg Hx   ? Dementia Neg Hx   ? Bipolar disorder Neg Hx   ? Anxiety disorder Neg Hx   ? OCD Neg Hx   ? Paranoid behavior Neg Hx   ? Schizophrenia Neg Hx   ? Sexual abuse Neg Hx   ? Physical abuse Neg Hx   ? ? ?Social History:  reports that she has been smoking cigarettes. She has a 4.50 pack-year smoking history. She has never used smokeless tobacco. She reports that she does not drink alcohol and does not use drugs. ? ?ROS: ?All other review of systems were reviewed and are negative except what is noted above in HPI ? ?Physical Exam: ?BP 113/74   Pulse (!) 118   LMP 06/26/2013   ?Constitutional:  Alert and oriented, No acute distress. ?HEENT: [place] AT, moist mucus membranes.  Trachea midline, no masses. ?Cardiovascular: No clubbing, cyanosis, or edema. ?Respiratory: Normal respiratory effort, no  increased work of breathing. ?GI: Abdomen is soft, nontender, nondistended, no abdominal masses ?GU: No CVA tenderness.  ?Lymph: No cervical or inguinal lymphadenopathy. ?Skin: No rashes, bruises or suspicious lesions. ?Neurologic: Grossly intact, no focal deficits, moving all 4 extremities. ?Psychiatric: Normal mood and affect. ? ?Laboratory Data: ?Lab Results  ?Component Value Date  ? WBC 12.6 (H) 03/29/2021  ? HGB 13.0 03/29/2021  ? HCT 39.1 03/29/2021  ? MCV 94.2 03/29/2021  ? PLT 221 03/29/2021  ? ? ?Lab Results  ?Component Value Date  ? CREATININE 1.07 (H) 03/29/2021  ? ? ?No results found for: PSA ? ?No results found for: TESTOSTERONE ? ?No results found for: HGBA1C ? ?Urinalysis ?   ?Component Value Date/Time  ? COLORURINE YELLOW 03/29/2021 0800  ? APPEARANCEUR Clear 06/19/2021 1655  ? LABSPEC 1.019 03/29/2021 0800  ? PHURINE 9.0 (H) 03/29/2021 0800  ? GLUCOSEU Negative 06/19/2021 1655  ? HGBUR NEGATIVE 03/29/2021 0800  ? BILIRUBINUR Negative 06/19/2021 1655  ? KETONESUR NEGATIVE 03/29/2021 0800  ? PROTEINUR Negative 06/19/2021 1655  ? PROTEINUR NEGATIVE 03/29/2021 0800  ? UROBILINOGEN 0.2 10/18/2008 2050  ? NITRITE Negative 06/19/2021 1655  ? NITRITE NEGATIVE 03/29/2021 0800  ? LEUKOCYTESUR Negative 06/19/2021 1655  ? LEUKOCYTESUR NEGATIVE 03/29/2021 0800  ? ? ?Lab Results  ?Component Value Date  ? LABMICR Comment 06/19/2021  ? WBCUA 0-5 04/03/2021  ? LABEPIT 0-10 04/03/2021  ? MUCUS Present 04/03/2021  ? BACTERIA Few 04/03/2021  ? ? ?Pertinent Imaging: ?Renal US 09/12/2021: Images reviewed and discussed with the patient ?Results for orders placed during the hospital encounter of 06/19/21 ? ?DG Abd 1 View ? ?Narrative ?CLINICAL DATA:  Post ESWL. ? ?EXAM: ?ABDOMEN - 1 VIEW ? ?COMPARISON:  Abdomen 04/11/2021.  CT 03/29/2021. ? ?FINDINGS: ?Previously identified right upper ureteral calculus noted on prior ?CT of 03/29/2021 no longer identified. Pelvic calcifications ?consistent with phleboliths again noted. Soft  tissue structures are ?unremarkable. No bowel distention or free air. Thoracolumbar spine ?scoliosis and degenerative change. ? ?IMPRESSION: ?Previously identified right upper ureteral calculus noted on prior ?CT of 03/29/2021 no longer identified. No acute abnormality ?identified. ? ? ?Electronically Signed ?By: Marcello Moores  Register M.D. ?On: 06/20/2021 07:00 ? ?No results found for this or any previous visit. ? ?No results found for this or any previous visit. ? ?No results found for this or any previous visit. ? ?No results found for this or any previous visit. ? ?No results found for this or any previous visit. ? ?No results found for this or any previous  visit. ? ?Results for orders placed during the hospital encounter of 12/17/20 ? ?CT Renal Stone Study ? ?Narrative ?CLINICAL DATA:  Right flank pain ? ?EXAM: ?CT ABDOMEN AND PELVIS WITHOUT CONTRAST ? ?TECHNIQUE: ?Multidetector CT imaging of the abdomen and pelvis was performed ?following the standard protocol without IV contrast. ? ?COMPARISON:  None. ? ?FINDINGS: ?Lower chest: No acute abnormality. ? ?Hepatobiliary: No focal hepatic abnormality. Gallbladder ?unremarkable. ? ?Pancreas: No focal abnormality or ductal dilatation. ? ?Spleen: No focal abnormality.  Normal size. ? ?Adrenals/Urinary Tract: 7 mm proximal right ureteral stone with mild ?right hydronephrosis. No hydronephrosis or stones on the left. ?Probable parapelvic cysts on the left. Adrenal glands and urinary ?bladder unremarkable. ? ?Stomach/Bowel: Stomach, large and small bowel grossly unremarkable. ?Appendix normal. ? ?Vascular/Lymphatic: No evidence of aneurysm or adenopathy. ? ?Reproductive: Uterus and adnexa unremarkable.  No mass. ? ?Other: No free fluid or free air. ? ?Musculoskeletal: No acute bony abnormality. ? ?IMPRESSION: ?7 mm proximal right ureteral stone with mild right hydronephrosis. ? ? ?Electronically Signed ?By: Rolm Baptise M.D. ?On: 12/17/2020 12:56 ? ? ?Assessment & Plan:    ? ?1. Nephrolithiasis ?-RTC 6 months with renal US and KUB ?- Urinalysis, Routine w reflex microscopic ? ? ?No follow-ups on file. ? ?Nicolette Bang, MD ? ?[facility] ?  ?

## 2021-09-13 NOTE — Patient Instructions (Signed)
Dietary Guidelines to Help Prevent Kidney Stones Kidney stones are deposits of minerals and salts that form inside your kidneys. Your risk of developing kidney stones may be greater depending on your diet, your lifestyle, the medicines you take, and whether you have certain medical conditions. Most people can lower their chances of developing kidney stones by following the instructions below. Your dietitian may give you more specific instructions depending on your overall health and the type of kidney stones you tend to develop. What are tips for following this plan? Reading food labels  Choose foods with "no salt added" or "low-salt" labels. Limit your salt (sodium) intake to less than 1,500 mg a day. Choose foods with calcium for each meal and snack. Try to eat about 300 mg of calcium at each meal. Foods that contain 200-500 mg of calcium a serving include: 8 oz (237 mL) of milk, calcium-fortifiednon-dairy milk, and calcium-fortifiedfruit juice. Calcium-fortified means that calcium has been added to these drinks. 8 oz (237 mL) of kefir, yogurt, and soy yogurt. 4 oz (114 g) of tofu. 1 oz (28 g) of cheese. 1 cup (150 g) of dried figs. 1 cup (91 g) of cooked broccoli. One 3 oz (85 g) can of sardines or mackerel. Most people need 1,000-1,500 mg of calcium a day. Talk to your dietitian about how much calcium is recommended for you. Shopping Buy plenty of fresh fruits and vegetables. Most people do not need to avoid fruits and vegetables, even if these foods contain nutrients that may contribute to kidney stones. When shopping for convenience foods, choose: Whole pieces of fruit. Pre-made salads with dressing on the side. Low-fat fruit and yogurt smoothies. Avoid buying frozen meals or prepared deli foods. These can be high in sodium. Look for foods with live cultures, such as yogurt and kefir. Choose high-fiber grains, such as whole-wheat breads, oat bran, and wheat cereals. Cooking Do not add  salt to food when cooking. Place a salt shaker on the table and allow each person to add his or her own salt to taste. Use vegetable protein, such as beans, textured vegetable protein (TVP), or tofu, instead of meat in pasta, casseroles, and soups. Meal planning Eat less salt, if told by your dietitian. To do this: Avoid eating processed or pre-made food. Avoid eating fast food. Eat less animal protein, including cheese, meat, poultry, or fish, if told by your dietitian. To do this: Limit the number of times you have meat, poultry, fish, or cheese each week. Eat a diet free of meat at least 2 days a week. Eat only one serving each day of meat, poultry, fish, or seafood. When you prepare animal protein, cut pieces into small portion sizes. For most meat and fish, one serving is about the size of the palm of your hand. Eat at least five servings of fresh fruits and vegetables each day. To do this: Keep fruits and vegetables on hand for snacks. Eat one piece of fruit or a handful of berries with breakfast. Have a salad and fruit at lunch. Have two kinds of vegetables at dinner. Limit foods that are high in a substance called oxalate. These include: Spinach (cooked), rhubarb, beets, sweet potatoes, and Swiss chard. Peanuts. Potato chips, french fries, and baked potatoes with skin on. Nuts and nut products. Chocolate. If you regularly take a diuretic medicine, make sure to eat at least 1 or 2 servings of fruits or vegetables that are high in potassium each day. These include: Avocado. Banana. Orange, prune,   carrot, or tomato juice. Baked potato. Cabbage. Beans and split peas. Lifestyle  Drink enough fluid to keep your urine pale yellow. This is the most important thing you can do. Spread your fluid intake throughout the day. If you drink alcohol: Limit how much you use to: 0-1 drink a day for women who are not pregnant. 0-2 drinks a day for men. Be aware of how much alcohol is in your  drink. In the [place] one drink equals one 12 oz bottle of beer (355 mL), one 5 oz glass of wine (148 mL), or one 1 oz glass of hard liquor (44 mL). Lose weight if told by your health care provider. Work with your dietitian to find an eating plan and weight loss strategies that work best for you. General information Talk to your health care provider and dietitian about taking daily supplements. You may be told the following depending on your health and the cause of your kidney stones: Not to take supplements with vitamin C. To take a calcium supplement. To take a daily probiotic supplement. To take other supplements such as magnesium, fish oil, or vitamin B6. Take over-the-counter and prescription medicines only as told by your health care provider. These include supplements. What foods should I limit? Limit your intake of the following foods, or eat them as told by your dietitian. Vegetables Spinach. Rhubarb. Beets. Canned vegetables. Pickles. Olives. Baked potatoes with skin. Grains Wheat bran. Baked goods. Salted crackers. Cereals high in sugar. Meats and other proteins Nuts. Nut butters. Large portions of meat, poultry, or fish. Salted, precooked, or cured meats, such as sausages, meat loaves, and hot dogs. Dairy Cheese. Beverages Regular soft drinks. Regular vegetable juice. Seasonings and condiments Seasoning blends with salt. Salad dressings. Soy sauce. Ketchup. Barbecue sauce. Other foods Canned soups. Canned pasta sauce. Casseroles. Pizza. Lasagna. Frozen meals. Potato chips. French fries. The items listed above may not be a complete list of foods and beverages you should limit. Contact a dietitian for more information. What foods should I avoid? Talk to your dietitian about specific foods you should avoid based on the type of kidney stones you have and your overall health. Fruits Grapefruit. The item listed above may not be a complete list of foods and beverages you should  avoid. Contact a dietitian for more information. Summary Kidney stones are deposits of minerals and salts that form inside your kidneys. You can lower your risk of kidney stones by making changes to your diet. The most important thing you can do is drink enough fluid. Drink enough fluid to keep your urine pale yellow. Talk to your dietitian about how much calcium you should have each day, and eat less salt and animal protein as told by your dietitian. This information is not intended to replace advice given to you by your health care provider. Make sure you discuss any questions you have with your health care provider. Document Revised: 05/21/2019 Document Reviewed: 05/21/2019 Elsevier Patient Education  2022 Elsevier Inc.  

## 2021-09-18 ENCOUNTER — Ambulatory Visit: Payer: BC Managed Care – PPO | Admitting: Urology

## 2022-03-12 ENCOUNTER — Ambulatory Visit (HOSPITAL_COMMUNITY): Payer: BC Managed Care – PPO

## 2022-03-16 ENCOUNTER — Ambulatory Visit: Payer: BC Managed Care – PPO | Admitting: Urology

## 2022-03-23 ENCOUNTER — Ambulatory Visit: Payer: BC Managed Care – PPO | Admitting: Urology

## 2022-03-23 ENCOUNTER — Telehealth: Payer: Self-pay

## 2022-03-23 DIAGNOSIS — N200 Calculus of kidney: Secondary | ICD-10-CM

## 2022-03-23 NOTE — Telephone Encounter (Signed)
Tired calling patient with no answer to reschedule appt due to not getting her KUB and renal U/S.Marland Kitchen Left voice message for patient to call back to reschedule appt.

## 2022-04-27 ENCOUNTER — Other Ambulatory Visit (HOSPITAL_BASED_OUTPATIENT_CLINIC_OR_DEPARTMENT_OTHER): Payer: Self-pay

## 2022-04-27 MED ORDER — WEGOVY 0.25 MG/0.5ML ~~LOC~~ SOAJ
0.2500 mg | SUBCUTANEOUS | 0 refills | Status: DC
  Filled 2022-04-27: qty 2, 28d supply, fill #0

## 2022-04-30 ENCOUNTER — Other Ambulatory Visit (HOSPITAL_BASED_OUTPATIENT_CLINIC_OR_DEPARTMENT_OTHER): Payer: Self-pay

## 2022-06-14 ENCOUNTER — Other Ambulatory Visit (HOSPITAL_BASED_OUTPATIENT_CLINIC_OR_DEPARTMENT_OTHER): Payer: Self-pay

## 2022-06-22 ENCOUNTER — Other Ambulatory Visit (HOSPITAL_BASED_OUTPATIENT_CLINIC_OR_DEPARTMENT_OTHER): Payer: Self-pay

## 2022-06-27 ENCOUNTER — Other Ambulatory Visit: Payer: Self-pay

## 2022-07-12 ENCOUNTER — Other Ambulatory Visit (HOSPITAL_BASED_OUTPATIENT_CLINIC_OR_DEPARTMENT_OTHER): Payer: Self-pay

## 2022-07-12 ENCOUNTER — Other Ambulatory Visit: Payer: Self-pay

## 2022-07-12 MED ORDER — WEGOVY 1 MG/0.5ML ~~LOC~~ SOAJ
1.0000 mg | SUBCUTANEOUS | 0 refills | Status: DC
  Filled 2022-07-12: qty 2, 28d supply, fill #0

## 2022-07-13 ENCOUNTER — Other Ambulatory Visit (HOSPITAL_BASED_OUTPATIENT_CLINIC_OR_DEPARTMENT_OTHER): Payer: Self-pay

## 2022-07-19 ENCOUNTER — Other Ambulatory Visit (HOSPITAL_BASED_OUTPATIENT_CLINIC_OR_DEPARTMENT_OTHER): Payer: Self-pay

## 2022-07-20 ENCOUNTER — Other Ambulatory Visit (HOSPITAL_BASED_OUTPATIENT_CLINIC_OR_DEPARTMENT_OTHER): Payer: Self-pay

## 2022-08-02 ENCOUNTER — Other Ambulatory Visit (HOSPITAL_BASED_OUTPATIENT_CLINIC_OR_DEPARTMENT_OTHER): Payer: Self-pay

## 2022-08-02 MED ORDER — WEGOVY 1.7 MG/0.75ML ~~LOC~~ SOAJ
1.7000 mg | SUBCUTANEOUS | 0 refills | Status: DC
  Filled 2022-08-02 – 2022-08-16 (×2): qty 3, 28d supply, fill #0

## 2022-08-03 ENCOUNTER — Encounter (HOSPITAL_BASED_OUTPATIENT_CLINIC_OR_DEPARTMENT_OTHER): Payer: Self-pay

## 2022-08-03 ENCOUNTER — Other Ambulatory Visit (HOSPITAL_BASED_OUTPATIENT_CLINIC_OR_DEPARTMENT_OTHER): Payer: Self-pay

## 2022-08-06 ENCOUNTER — Other Ambulatory Visit (HOSPITAL_BASED_OUTPATIENT_CLINIC_OR_DEPARTMENT_OTHER): Payer: Self-pay

## 2022-08-12 ENCOUNTER — Encounter (HOSPITAL_BASED_OUTPATIENT_CLINIC_OR_DEPARTMENT_OTHER): Payer: Self-pay | Admitting: Pharmacist

## 2022-08-12 ENCOUNTER — Other Ambulatory Visit (HOSPITAL_BASED_OUTPATIENT_CLINIC_OR_DEPARTMENT_OTHER): Payer: Self-pay

## 2022-08-16 ENCOUNTER — Other Ambulatory Visit (HOSPITAL_BASED_OUTPATIENT_CLINIC_OR_DEPARTMENT_OTHER): Payer: Self-pay

## 2022-08-17 ENCOUNTER — Other Ambulatory Visit (HOSPITAL_BASED_OUTPATIENT_CLINIC_OR_DEPARTMENT_OTHER): Payer: Self-pay

## 2022-09-10 ENCOUNTER — Ambulatory Visit (INDEPENDENT_AMBULATORY_CARE_PROVIDER_SITE_OTHER): Payer: BC Managed Care – PPO | Admitting: Adult Health

## 2022-09-10 ENCOUNTER — Other Ambulatory Visit (HOSPITAL_COMMUNITY)
Admission: RE | Admit: 2022-09-10 | Discharge: 2022-09-10 | Disposition: A | Discharge status: Home / Self Care | Payer: BC Managed Care – PPO | Source: Ambulatory Visit | Attending: Adult Health | Admitting: Adult Health

## 2022-09-10 ENCOUNTER — Encounter: Payer: Self-pay | Admitting: Adult Health

## 2022-09-10 VITALS — BP 146/83 | HR 99 | Ht 64.0 in | Wt 208.0 lb

## 2022-09-10 DIAGNOSIS — Z01419 Encounter for gynecological examination (general) (routine) without abnormal findings: Secondary | ICD-10-CM | POA: Insufficient documentation

## 2022-09-10 DIAGNOSIS — Z1211 Encounter for screening for malignant neoplasm of colon: Secondary | ICD-10-CM | POA: Diagnosis not present

## 2022-09-10 LAB — HEMOCCULT GUIAC POC 1CARD (OFFICE): Fecal Occult Blood, POC: NEGATIVE

## 2022-09-10 NOTE — Progress Notes (Signed)
Patient ID: Joanne Frazier, female   DOB: Aug 21, 1965, 57 y.o.   MRN: [id number] History of Present Illness: Joanne Frazier is a 57 year old white female, divorced, PM in for a well woman gyn exam and pap. She works at [employer].  She has lost 17 lbs since December on Wegovy.   PCP is [facility] in [place].   Current Medications, Allergies, Past Medical History, Past Surgical History, Family History and Social History were reviewed in [employer] record.     Review of Systems: Patient denies any headaches, hearing loss, fatigue, blurred vision, shortness of breath, chest pain, abdominal pain, problems with bowel movements, urination(has some urgency at times), or intercourse(not active). No joint pain or mood swings.     Physical Exam:BP (!) 146/83 (BP Location: Right Arm, Patient Position: Sitting, Cuff Size: Large)   Pulse 99   Ht 5\' 4"  (1.626 m)   Wt 208 lb (94.3 kg)   LMP 06/26/2013   BMI 35.70 kg/m   General:  Well developed, well nourished, no acute distress Skin:  Warm and dry Neck:  Midline trachea, normal thyroid, good ROM, no lymphadenopathy Lungs; Clear to auscultation bilaterally Breast:  No dominant palpable mass, retraction, or nipple discharge Cardiovascular: Regular rate and rhythm Abdomen:  Soft, non tender, no hepatosplenomegaly Pelvic:  External genitalia is normal in appearance, no lesions.  The vagina is normal in appearance. Urethra has no lesions or masses. The cervix is bulbous.  Uterus is felt to be normal size, shape, and contour.  No adnexal masses or tenderness noted.Bladder is non tender, no masses felt. Rectal: Good sphincter tone, no polyps, or hemorrhoids felt.  Hemoccult negative. Extremities/musculoskeletal:  No swelling or varicosities noted, no clubbing or cyanosis Psych:  No mood changes, alert and cooperative,seems happy AA is 0 Fall risk is low    09/10/2022    3:13 PM  Depression screen PHQ 2/9  Decreased Interest 1  Down, Depressed,  Hopeless 0  PHQ - 2 Score 1  Altered sleeping 0  Tired, decreased energy 1  Change in appetite 0  Feeling bad or failure about yourself  0  Trouble concentrating 0  Moving slowly or fidgety/restless 0  Suicidal thoughts 0  PHQ-9 Score 2       09/10/2022    3:14 PM  GAD 7 : Generalized Anxiety Score  Nervous, Anxious, on Edge 0  Control/stop worrying 0  Worry too much - different things 0  Trouble relaxing 0  Restless 0  Easily annoyed or irritable 0  Afraid - awful might happen 0  Total GAD 7 Score 0      Upstream - 09/10/22 1511       Pregnancy Intention Screening   Does the patient want to become pregnant in the next year? No    Does the patient's partner want to become pregnant in the next year? No    Would the patient like to discuss contraceptive options today? No      Contraception Wrap Up   Current Method No Method - Other Reason   postmenopausal   Reason for No Current Contraceptive Method at Intake ([facility] Only) Other    End Method No Method - Other Reason   postmenopausal   Contraception Counseling Provided No             Examination chaperoned by Levy Pupa LPN   Impression and Plan: 1. Encounter for gynecological examination with Papanicolaou smear of cervix Pap sent Pap in 3 years if  normal Physical with PCP  Labs with PCP Mammogram was negative last year at [facility] She has Cologuard at home to do - Cytology - PAP( [facility]) Stay active   2. Encounter for screening fecal occult blood testing Hemoccult as negative  - POCT occult blood stool

## 2022-09-13 LAB — CYTOLOGY - PAP
Comment: NEGATIVE
Diagnosis: NEGATIVE
High risk HPV: NEGATIVE

## 2022-11-17 ENCOUNTER — Emergency Department (HOSPITAL_COMMUNITY)
Admission: EM | Admit: 2022-11-17 | Discharge: 2022-11-17 | Disposition: A | Discharge status: Home / Self Care | Payer: BC Managed Care – PPO | Attending: Emergency Medicine | Admitting: Emergency Medicine

## 2022-11-17 ENCOUNTER — Other Ambulatory Visit: Payer: Self-pay

## 2022-11-17 ENCOUNTER — Encounter (HOSPITAL_COMMUNITY): Payer: Self-pay

## 2022-11-17 ENCOUNTER — Emergency Department (HOSPITAL_COMMUNITY): Payer: BC Managed Care – PPO

## 2022-11-17 DIAGNOSIS — R29810 Facial weakness: Secondary | ICD-10-CM | POA: Diagnosis present

## 2022-11-17 DIAGNOSIS — Z79899 Other long term (current) drug therapy: Secondary | ICD-10-CM | POA: Insufficient documentation

## 2022-11-17 DIAGNOSIS — I10 Essential (primary) hypertension: Secondary | ICD-10-CM | POA: Diagnosis not present

## 2022-11-17 LAB — DIFFERENTIAL
Abs Immature Granulocytes: 0.02 10*3/uL (ref 0.00–0.07)
Basophils Absolute: 0 10*3/uL (ref 0.0–0.1)
Basophils Relative: 1 %
Eosinophils Absolute: 0.1 10*3/uL (ref 0.0–0.5)
Eosinophils Relative: 1 %
Immature Granulocytes: 0 %
Lymphocytes Relative: 30 %
Lymphs Abs: 2 10*3/uL (ref 0.7–4.0)
Monocytes Absolute: 0.4 10*3/uL (ref 0.1–1.0)
Monocytes Relative: 6 %
Neutro Abs: 4 10*3/uL (ref 1.7–7.7)
Neutrophils Relative %: 62 %

## 2022-11-17 LAB — CBC
HCT: 38.7 % (ref 36.0–46.0)
Hemoglobin: 13 g/dL (ref 12.0–15.0)
MCH: 30.9 pg (ref 26.0–34.0)
MCHC: 33.6 g/dL (ref 30.0–36.0)
MCV: 91.9 fL (ref 80.0–100.0)
Platelets: 193 10*3/uL (ref 150–400)
RBC: 4.21 MIL/uL (ref 3.87–5.11)
RDW: 12.6 % (ref 11.5–15.5)
WBC: 6.4 10*3/uL (ref 4.0–10.5)
nRBC: 0 % (ref 0.0–0.2)

## 2022-11-17 LAB — COMPREHENSIVE METABOLIC PANEL
ALT: 14 U/L (ref 0–44)
AST: 14 U/L — ABNORMAL LOW (ref 15–41)
Albumin: 3.8 g/dL (ref 3.5–5.0)
Alkaline Phosphatase: 85 U/L (ref 38–126)
Anion gap: 8 (ref 5–15)
BUN: 15 mg/dL (ref 6–20)
CO2: 25 mmol/L (ref 22–32)
Calcium: 8.7 mg/dL — ABNORMAL LOW (ref 8.9–10.3)
Chloride: 104 mmol/L (ref 98–111)
Creatinine, Ser: 0.79 mg/dL (ref 0.44–1.00)
GFR, Estimated: >60 mL/min (ref >60)
Glucose, Bld: 98 mg/dL (ref 70–99)
Potassium: 3.7 mmol/L (ref 3.5–5.1)
Sodium: 137 mmol/L (ref 135–145)
Total Bilirubin: 0.7 mg/dL (ref 0.3–1.2)
Total Protein: 7.2 g/dL (ref 6.5–8.1)

## 2022-11-17 LAB — PROTIME-INR
INR: 1 (ref 0.8–1.2)
Prothrombin Time: 13.8 seconds (ref 11.4–15.2)

## 2022-11-17 LAB — ETHANOL: Alcohol, Ethyl (B): <10 mg/dL (ref <10)

## 2022-11-17 LAB — APTT: aPTT: 30 seconds (ref 24–36)

## 2022-11-17 MED ORDER — VALACYCLOVIR HCL 500 MG PO TABS
1000.0000 mg | ORAL_TABLET | ORAL | Status: AC
  Administered 2022-11-17: 1000 mg via ORAL
  Filled 2022-11-17: qty 2

## 2022-11-17 MED ORDER — VALACYCLOVIR HCL 1 G PO TABS: 1000.0000 mg | ORAL_TABLET | Freq: Three times a day (TID) | ORAL | 0 refills | Status: DC

## 2022-11-17 MED ORDER — PREDNISONE 20 MG PO TABS
40.0000 mg | ORAL_TABLET | Freq: Once | ORAL | Status: AC
  Administered 2022-11-17: 40 mg via ORAL
  Filled 2022-11-17: qty 2

## 2022-11-17 MED ORDER — PREDNISONE 20 MG PO TABS: 40.0000 mg | ORAL_TABLET | Freq: Every day | ORAL | 0 refills | Status: DC

## 2022-11-17 NOTE — Discharge Instructions (Signed)
It is not exactly clear whether you are having a stroke or having a Bell's palsy.  Please read the attached instructions about a Bell's palsy.  You are to take the following medications  Prednisone daily for 7 days  Valtrex 3 times a day for 7 days  Ultimately your symptoms could also be related to a stroke however the only way to know for sure is to have an MRI.  You are choosing to not have an MRI today but rather to follow-up with your family doctor.  Please call them first thing on Monday morning and let them know that you were seen in the emergency department for facial droop and that you would need to have the rest of the stroke workup.  This includes an MRI and an echocardiogram of your heart.  Please confirm this with them on the phone and have them schedule it.  You will need to use an eyedrop like artificial tears to help your eye stay lubricated as your left eye will not blink appropriately.  If you do not use the eyedrops it may become very dry and get a scratch.  Also do not wear contact lenses until your eyes are totally back to normal.  You are to return to the emergency department immediately for any severe worsening symptoms especially any difficulty with arms or legs, weakness, numbness, coordination, changes in vision or speech.

## 2022-11-17 NOTE — ED Notes (Signed)
Patient transported to CT 

## 2022-11-17 NOTE — ED Triage Notes (Signed)
Pt reports she woke up with headache and left side facial droop.

## 2022-11-17 NOTE — ED Provider Notes (Signed)
[facility] EMERGENCY DEPARTMENT AT [facility] Provider Note   CSN: [id number] Arrival date & time: 11/17/22  1252     History  Chief Complaint  Patient presents with   Facial Droop    Joanne Frazier is a 57 y.o. female.  HPI    This patient is a 57 year old female, she has a history of hypertension on amlodipine, she also takes Cymbalta and [place] which she uses for weight loss.  The patient reports that she was feeling fine when she went to bed last night, when she woke up this morning she had a headache on the left side.  Short time later her daughter looked at her and noticed that the left side of her face was drooping, she had some difficulty drinking out of a glass though she had no difficulty using arms or legs.  She was able to drive herself to the hospital, get herself dressed and had no issues with mobility, coordination or strength.  Her ambulation was normal, her vision is normal.  She has never had anything like this in the past.  She has no chest pain or shortness of breath, no fevers chills nausea vomiting or diarrhea.  Home Medications Prior to Admission medications   Medication Sig Start Date End Date Taking? Authorizing Provider  predniSONE (DELTASONE) 20 MG tablet Take 2 tablets (40 mg total) by mouth daily. 11/17/22  Yes Eber Hong, MD  valACYclovir (VALTREX) 1000 MG tablet Take 1 tablet (1,000 mg total) by mouth 3 (three) times daily. 11/17/22  Yes Eber Hong, MD  amLODipine (NORVASC) 5 MG tablet SMARTSIG:1 Tablet(s) By Mouth Every Evening Patient not taking: Reported on 09/10/2022 03/02/21   [provider]  Cholecalciferol 125 MCG (5000 UT) TABS 1 tablet Orally Once a day    [provider]  Cyanocobalamin 1000 MCG TBCR 2 tablets Orally Once a day    [provider]  DULoxetine (CYMBALTA) 60 MG capsule Take 60 mg by mouth daily. 06/18/21   [provider]  fluticasone (FLONASE) 50 MCG/ACT nasal spray 1 - 2 sprays in  each nostril Nasally Once a day    [provider]  Semaglutide-Weight Management (WEGOVY) 1.7 MG/0.75ML SOAJ Inject 1.7 mg into the skin once a week. 08/02/22     WEGOVY 2.4 MG/0.75ML SOAJ Inject into the skin. Patient not taking: Reported on 09/10/2022    [provider]      Allergies    Bupropion, Losartan, Metoprolol, Naproxen sodium, Sulfa antibiotics, and Prednisone    Review of Systems   Review of Systems  All other systems reviewed and are negative.   Physical Exam Updated Vital Signs BP 136/85   Pulse 100   Resp 18   Ht 1.626 m (5\' 4" )   Wt 94 kg   LMP 06/26/2013   SpO2 99%   BMI 35.57 kg/m  Physical Exam Vitals and nursing note reviewed.  Constitutional:      General: She is not in acute distress.    Appearance: She is well-developed.  HENT:     Head: Normocephalic and atraumatic.     Mouth/Throat:     Pharynx: No oropharyngeal exudate.  Eyes:     General: No scleral icterus.       Right eye: No discharge.        Left eye: No discharge.     Conjunctiva/sclera: Conjunctivae normal.     Pupils: Pupils are equal, round, and reactive to light.  Neck:  Thyroid: No thyromegaly.     Vascular: No JVD.  Cardiovascular:     Rate and Rhythm: Normal rate and regular rhythm.     Heart sounds: Normal heart sounds. No murmur heard.    No friction rub. No gallop.  Pulmonary:     Effort: Pulmonary effort is normal. No respiratory distress.     Breath sounds: Normal breath sounds. No wheezing or rales.  Abdominal:     General: Bowel sounds are normal. There is no distension.     Palpations: Abdomen is soft. There is no mass.     Tenderness: There is no abdominal tenderness.  Musculoskeletal:        General: No tenderness. Normal range of motion.     Cervical back: Normal range of motion and neck supple.     Right lower leg: No edema.     Left lower leg: No edema.  Lymphadenopathy:     Cervical: No cervical adenopathy.  Skin:    General: Skin is  warm and dry.     Findings: No erythema or rash.  Neurological:     Mental Status: She is alert.     Coordination: Coordination normal.     Comments: Has a left-sided facial droop, inability to completely close the left eye, slight inability to raise the left forehead, facial droop present, sensory deficit slightly on the left  Psychiatric:        Behavior: Behavior normal.     ED Results / Procedures / Treatments   Labs (all labs ordered are listed, but only abnormal results are displayed) Labs Reviewed  COMPREHENSIVE METABOLIC PANEL - Abnormal; Notable for the following components:      Result Value   Calcium 8.7 (*)    AST 14 (*)    All other components within normal limits  ETHANOL  PROTIME-INR  APTT  CBC  DIFFERENTIAL  RAPID URINE DRUG SCREEN, HOSP PERFORMED    EKG None  Radiology CT HEAD WO CONTRAST  Result Date: 11/17/2022 CLINICAL DATA:  Left facial droop, neurological deficit EXAM: CT HEAD WITHOUT CONTRAST TECHNIQUE: Contiguous axial images were obtained from the base of the skull through the vertex without intravenous contrast. RADIATION DOSE REDUCTION: This exam was performed according to the departmental dose-optimization program which includes automated exposure control, adjustment of the mA and/or kV according to patient size and/or use of iterative reconstruction technique. COMPARISON:  Images of previous study done on 02/27/2003 are not available for review. Report for the previous study was reviewed. FINDINGS: Brain: No acute intracranial findings are seen. There are no signs of bleeding within the cranium. Ventricles are not dilated. There is no focal edema or mass effect. Vascular: Unremarkable. Skull: No acute findings are seen. Sinuses/Orbits: Unremarkable. Other: None. IMPRESSION: No acute intracranial findings are seen. Electronically Signed   By: Ernie Avena M.D.   On: 11/17/2022 13:21    Procedures Procedures    Medications Ordered in  ED Medications  predniSONE (DELTASONE) tablet 40 mg (has no administration in time range)  valACYclovir (VALTREX) tablet 1,000 mg (has no administration in time range)    ED Course/ Medical Decision Making/ A&P Clinical Course as of 11/17/22 1428  Sat Nov 17, 2022  1306 Last seen normal approximately 14 hours ago [BM]    Clinical Course User Index [BM] Eber Hong, MD  Medical Decision Making Amount and/or Complexity of Data Reviewed Labs: ordered. Radiology: ordered.  Risk Prescription drug management.    This patient presents to the ED for concern of left-sided facial droop, this involves an extensive number of treatment options, and is a complaint that carries with it a high risk of complications and morbidity.  The differential diagnosis includes stroke versus Bell's palsy, versus other peripheral nerve palsy   Co morbidities that complicate the patient evaluation  Hypertension   Additional history obtained:  Additional history obtained from medical record External records from outside source obtained and reviewed including followed in the office for obesity, hypertension, history of kidney stone back in 2022, no recent admissions to the hospital   Lab Tests:  I Ordered, and personally interpreted labs.  The pertinent results include: Hall undetectable, INR normal, CBC normal, metabolic panel without acute findings,   Imaging Studies ordered:  I ordered imaging studies including CT scan of the brain without contrast I independently visualized and interpreted imaging which showed no acute findings, this means no signs of stroke, hemorrhage, aneurysms, masses or other significant abnormalities I agree with the radiologist interpretation   Cardiac Monitoring: / EKG:  The patient was maintained on a cardiac monitor.  I personally viewed and interpreted the cardiac monitored which showed an underlying rhythm of: Normal sinus  rhythm   Consultations Obtained:  I requested consultation with the neurologist Dr. Selina Cooley,  and discussed lab and imaging findings as well as pertinent plan - they recommend: Transferring for MRI, I agree that this may be a Bell palsy however MRI would be needed to formally confirm   Problem List / ED Course / Critical interventions / Medication management  I had a long discussion with this patient regarding the risk benefits and alternatives of being treated for a Bell palsy versus being worked up for a stroke.  My formal recommendation was that she would be transferred for an MRI but she states that since she is outside the window for thrombolytic therapy and her symptoms are more related to a focal facial droop she would rather follow-up with her family doctor to have this done as an outpatient and not be transferred for an MRI.  She understands that the consequences of this are that if it is a stroke it could get worse and cause more severe symptoms.  She is totally able to state this and understands the consequences.  She is agreeable to take the care for Bell palsy and have her family doctor follow-up next week.  She was given first dose of prednisone and Valtrex in the ER I ordered medication including prednisone and Val acyclovir for possible Bell palsy Reevaluation of the patient after these medicines showed that the patient no significant improvement I have reviewed the patients home medicines and have made adjustments as needed   Social Determinants of Health:  Patient has medical decision-making capacity   Test / Admission - Considered:  Considered admission and transfer for MRI but patient declines  I have discussed with the patient at the bedside the results, and the meaning of these results.  They have expressed her understanding to the need for follow-up with primary care physician         Final Clinical Impression(s) / ED Diagnoses Final diagnoses:  Facial droop     Rx / DC Orders ED Discharge Orders          Ordered    predniSONE (DELTASONE) 20 MG tablet  Daily  11/17/22 1424    valACYclovir (VALTREX) 1000 MG tablet  3 times daily        11/17/22 1424              Eber Hong, MD 11/17/22 1428

## 2022-11-26 ENCOUNTER — Encounter: Payer: Self-pay | Admitting: Neurology

## 2022-11-26 ENCOUNTER — Ambulatory Visit: Payer: BC Managed Care – PPO | Admitting: Neurology

## 2022-11-26 VITALS — BP 124/85 | HR 80 | Ht 64.0 in | Wt 196.0 lb

## 2022-11-26 DIAGNOSIS — G510 Bell's palsy: Secondary | ICD-10-CM | POA: Diagnosis not present

## 2022-11-26 NOTE — Progress Notes (Signed)
[facility]  PATIENT: Joanne Frazier DOB: 13-Nov-1965  REQUESTING CLINICIAN: Donetta Potts, MD HISTORY FROM: Patient  REASON FOR VISIT: Left facial weakness    HISTORICAL  CHIEF COMPLAINT:  Chief Complaint  Patient presents with   New Patient (Initial Visit)    Rm 13. Patient alone. Patient reports finishing the steroid and the valtrex in the last couple of days. Left sided facial droop, eye and mouth on that side. Jaw and ear were bothering her but medicine helped, but now coming back since finishing the medication    HISTORY OF PRESENT ILLNESS:  This is a 56 year old woman with past medical history of hearing impairment, B12 deficiency, depression who is presenting after being diagnosed with left facial weakness, Bell's palsy on June 8.  Patient reported 2 days prior to presentation she was experiencing left ear pain left facial pain and on Saturday morning she woke up and was drooling after drinking a protein shake.  She look herself on the mirror and found that her left face was droopy.  She presented to the ED concerning for stroke, she was found to have upper and lower facial weakness, normal head CT and diagnosed with Bell's palsy.  She was started on prednisone and antiviral which she completed yesterday.  For some reason she was also ordered for MRI and MRA which is scheduled for tomorrow.  Since then, she reported that her weakness has improved, she is able to better close her eyelid, and there is decrease sensation and the pain have improved.  She never experienced any previous history of Bell's palsy.  No other complaints.  Denies any weakness of the arm or leg and denies any numbness of the arm or leg.    OTHER MEDICAL CONDITIONS: B12 deficiency, Hearing impairment    REVIEW OF SYSTEMS: Full 14 system review of systems performed and negative with exception of: As noted in the HPI   ALLERGIES: Allergies  Allergen Reactions   Bupropion Other (See  Comments)    Depression   Losartan Other (See Comments)    Jitteriness    Metoprolol Nausea And Vomiting   Naproxen Sodium Other (See Comments)    Funny feeling   Sulfa Antibiotics Other (See Comments)    Unknown.    Prednisone Other (See Comments) and Palpitations    Shakiness    HOME MEDICATIONS: Outpatient Medications Prior to Visit  Medication Sig Dispense Refill   Cholecalciferol 125 MCG (5000 UT) TABS 1 tablet Orally Once a day     Cyanocobalamin 1000 MCG TBCR 2 tablets Orally Once a day     DULoxetine (CYMBALTA) 60 MG capsule Take 60 mg by mouth daily.     fluticasone (FLONASE) 50 MCG/ACT nasal spray 1 - 2 sprays in each nostril Nasally Once a day     WEGOVY 2.4 MG/0.75ML SOAJ Inject into the skin.     amLODipine (NORVASC) 5 MG tablet SMARTSIG:1 Tablet(s) By Mouth Every Evening (Patient not taking: Reported on 09/10/2022)     predniSONE (DELTASONE) 20 MG tablet Take 2 tablets (40 mg total) by mouth daily. (Patient not taking: Reported on 11/26/2022) 14 tablet 0   Semaglutide-Weight Management (WEGOVY) 1.7 MG/0.75ML SOAJ Inject 1.7 mg into the skin once a week. 3 mL 0   valACYclovir (VALTREX) 1000 MG tablet Take 1 tablet (1,000 mg total) by mouth 3 (three) times daily. (Patient not taking: Reported on 11/26/2022) 21 tablet 0   No facility-administered medications prior to visit.    PAST  MEDICAL HISTORY: Past Medical History:  Diagnosis Date   Depression    Headache(784.0)    Hearing impairment    History of anemia    Hypertension    Irritable bowel syndrome    Missed period 08/19/2013   Obesity    Thyroid disease     PAST SURGICAL HISTORY: Past Surgical History:  Procedure Laterality Date   EXTRACORPOREAL SHOCK WAVE LITHOTRIPSY Right 04/11/2021   Procedure: EXTRACORPOREAL SHOCK WAVE LITHOTRIPSY (ESWL);  Surgeon: Malen Gauze, MD;  Location: [facility];  Service: Urology;  Laterality: Right;   WISDOM TOOTH EXTRACTION Bilateral     FAMILY HISTORY: Family History   Problem Relation Age of Onset   Depression Maternal Grandmother        "Nervous Breakdown"   Colon cancer Maternal Grandfather    Cancer Maternal Grandfather        colon   Cancer Father        stage 4 lung cancer   Stroke Father    Diabetes Father    Seizures Father    Hypertension Mother    Depression Brother    Depression Brother    Depression Sister    ADD / ADHD Daughter    ADD / ADHD Son    Alcohol abuse Paternal Uncle    Cirrhosis Paternal Uncle    Drug abuse Neg Hx    Dementia Neg Hx    Bipolar disorder Neg Hx    Anxiety disorder Neg Hx    OCD Neg Hx    Paranoid behavior Neg Hx    Schizophrenia Neg Hx    Sexual abuse Neg Hx    Physical abuse Neg Hx     SOCIAL HISTORY: Social History   Socioeconomic History   Marital status: Divorced    Spouse name: Not on file   Number of children: Not on file   Years of education: Not on file   Highest education level: Not on file  Occupational History   Not on file  Tobacco Use   Smoking status: Some Days    Packs/day: 0.50    Years: 9.00    Additional pack years: 0.00    Total pack years: 4.50    Types: Cigarettes   Smokeless tobacco: Never  Vaping Use   Vaping Use: Never used  Substance and Sexual Activity   Alcohol use: No   Drug use: No   Sexual activity: Not Currently    Birth control/protection: Post-menopausal  Other Topics Concern   Not on file  Social History Narrative   Not on file   Social Determinants of Health   Financial Resource Strain: Low Risk  (09/10/2022)   Overall Financial Resource Strain (CARDIA)    Difficulty of Paying Living Expenses: Not hard at all  Food Insecurity: No Food Insecurity (09/10/2022)   Hunger Vital Sign    Worried About Running Out of Food in the Last Year: Never true    Ran Out of Food in the Last Year: Never true  Transportation Needs: No Transportation Needs (09/10/2022)   PRAPARE - Administrator, Civil Service (Medical): No    Lack of Transportation  (Non-Medical): No  Physical Activity: Insufficiently Active (09/10/2022)   Exercise Vital Sign    Days of Exercise per Week: 3 days    Minutes of Exercise per Session: 20 min  Stress: No Stress Concern Present (09/10/2022)   [employer] of Occupational Health - Occupational Stress Questionnaire    Feeling of Stress :  Not at all  Social Connections: Socially Isolated (09/10/2022)   Social Connection and Isolation Panel [NHANES]    Frequency of Communication with Friends and Family: Never    Frequency of Social Gatherings with Friends and Family: Twice a week    Attends Religious Services: Never    Database administrator or Organizations: No    Attends Banker Meetings: Never    Marital Status: Divorced  Catering manager Violence: Not At Risk (09/10/2022)   Humiliation, Afraid, Rape, and Kick questionnaire    Fear of Current or Ex-Partner: No    Emotionally Abused: No    Physically Abused: No    Sexually Abused: No     PHYSICAL EXAM  GENERAL EXAM/CONSTITUTIONAL: Vitals:  Vitals:   11/26/22 1328  BP: 124/85  Pulse: 80  Weight: 196 lb (88.9 kg)  Height: 5\' 4"  (1.626 m)   Body mass index is 33.64 kg/m. Wt Readings from Last 3 Encounters:  11/26/22 196 lb (88.9 kg)  11/17/22 207 lb 3.7 oz (94 kg)  09/10/22 208 lb (94.3 kg)   Patient is in no distress; well developed, nourished and groomed; neck is supple  MUSCULOSKELETAL: Gait, strength, tone, movements noted in Neurologic exam below  NEUROLOGIC: MENTAL STATUS:      No data to display         awake, alert, oriented to person, place and time recent and remote memory intact normal attention and concentration language fluent, comprehension intact, naming intact fund of knowledge appropriate  CRANIAL NERVE:  2nd, 3rd, 4th, 6th - Visual fields full to confrontation, extraocular muscles intact, no nystagmus 5th - facial sensation symmetric 7th - there is left facial upper and lower weakness 8th -  hearing intact 9th - palate elevates symmetrically, uvula midline 11th - shoulder shrug symmetric 12th - tongue protrusion midline  MOTOR:  normal bulk and tone, full strength in the BUE, BLE  SENSORY:  normal and symmetric to light touch  COORDINATION:  finger-nose-finger, fine finger movements normal  REFLEXES:  deep tendon reflexes present and symmetric  GAIT/STATION:  normal   DIAGNOSTIC DATA (LABS, IMAGING, TESTING) - I reviewed patient records, labs, notes, testing and imaging myself where available.  Lab Results  Component Value Date   WBC 6.4 11/17/2022   HGB 13.0 11/17/2022   HCT 38.7 11/17/2022   MCV 91.9 11/17/2022   PLT 193 11/17/2022      Component Value Date/Time   NA 137 11/17/2022 1321   K 3.7 11/17/2022 1321   CL 104 11/17/2022 1321   CO2 25 11/17/2022 1321   GLUCOSE 98 11/17/2022 1321   BUN 15 11/17/2022 1321   CREATININE 0.79 11/17/2022 1321   CREATININE 0.69 08/19/2013 1542   CALCIUM 8.7 (L) 11/17/2022 1321   PROT 7.2 11/17/2022 1321   ALBUMIN 3.8 11/17/2022 1321   AST 14 (L) 11/17/2022 1321   ALT 14 11/17/2022 1321   ALKPHOS 85 11/17/2022 1321   BILITOT 0.7 11/17/2022 1321   GFRNONAA >60 11/17/2022 1321   GFRAA >60 11/12/2019 0234   Lab Results  Component Value Date   CHOL 156 08/19/2013   HDL 26 (L) 08/19/2013   LDLCALC 91 08/19/2013   TRIG 196 (H) 08/19/2013   CHOLHDL 6.0 08/19/2013   No results found for: "HGBA1C" No results found for: "VITAMINB12" Lab Results  Component Value Date   TSH 2.668 08/19/2013    Head CT 11/17/2022 No acute intracranial findings are seen.    ASSESSMENT AND PLAN  57  y.o. year old female with hearing impairment, B12 deficiency, depression who is presenting with complaint of left upper and lower facial weakness, negative head CT consistent with Bell's palsy.  Patient completed her course of steroid and antiviral.  She reports improvement in her smile and her ability to close her left eyelid.  I  have advised patient to use a tape at night to avoid corneal scarring.  Continue her current medications, and continue with facial exercises; she can try and puff her cheek, smile, close her eyelids very tight and raise her eyebrows.  Continue to follow with his PCP and return as needed.  I have also informed patient that most patient with Bell's palsy do get complete recovery at the 1 year mark.  She voiced understanding.  I also gave her some information regarding Bell's palsy for review.   1. Bell's palsy      Patient Instructions  Continue current medications  Follow up as needed   No orders of the defined types were placed in this encounter.   No orders of the defined types were placed in this encounter.   Return if symptoms worsen or fail to improve.    Windell Norfolk, MD 11/26/2022, 5:37 PM  [facility] [address] [place], [place] [address] [phone]

## 2022-11-26 NOTE — Patient Instructions (Signed)
Continue current medications  Follow up as needed

## 2023-02-14 ENCOUNTER — Other Ambulatory Visit: Payer: Self-pay | Admitting: Oncology

## 2023-02-14 DIAGNOSIS — Z006 Encounter for examination for normal comparison and control in clinical research program: Secondary | ICD-10-CM

## 2023-02-14 LAB — COLOGUARD: COLOGUARD: NEGATIVE

## 2023-03-22 ENCOUNTER — Encounter (INDEPENDENT_AMBULATORY_CARE_PROVIDER_SITE_OTHER): Payer: Self-pay

## 2023-03-27 ENCOUNTER — Other Ambulatory Visit (HOSPITAL_COMMUNITY): Payer: BC Managed Care – PPO | Attending: Oncology

## 2023-08-05 ENCOUNTER — Ambulatory Visit: Payer: BC Managed Care – PPO | Admitting: Adult Health

## 2024-04-10 ENCOUNTER — Other Ambulatory Visit: Payer: Self-pay | Admitting: Medical Genetics

## 2024-04-10 DIAGNOSIS — Z006 Encounter for examination for normal comparison and control in clinical research program: Secondary | ICD-10-CM

## 2024-05-24 LAB — GENECONNECT MOLECULAR SCREEN: Genetic Analysis Overall Interpretation: NEGATIVE
